# Patient Record
Sex: Female | Born: 1970 | Race: Black or African American | Hispanic: No | Marital: Married | Smoking: Never smoker
Health system: Southern US, Community
[De-identification: ages and names within clinical notes are randomized; demographics above are authoritative.]

## PROBLEM LIST (undated history)

## (undated) ENCOUNTER — Ambulatory Visit (HOSPITAL_COMMUNITY): Admission: EM

## (undated) DIAGNOSIS — B20 Human immunodeficiency virus [HIV] disease: Secondary | ICD-10-CM

## (undated) DIAGNOSIS — O8823 Thromboembolism in the puerperium: Secondary | ICD-10-CM

## (undated) DIAGNOSIS — J45909 Unspecified asthma, uncomplicated: Secondary | ICD-10-CM

## (undated) DIAGNOSIS — Z21 Asymptomatic human immunodeficiency virus [HIV] infection status: Secondary | ICD-10-CM

## (undated) DIAGNOSIS — Z332 Encounter for elective termination of pregnancy: Secondary | ICD-10-CM

## (undated) DIAGNOSIS — Z8619 Personal history of other infectious and parasitic diseases: Secondary | ICD-10-CM

## (undated) DIAGNOSIS — Z9229 Personal history of other drug therapy: Secondary | ICD-10-CM

## (undated) DIAGNOSIS — Z9889 Other specified postprocedural states: Secondary | ICD-10-CM

## (undated) DIAGNOSIS — G4761 Periodic limb movement disorder: Secondary | ICD-10-CM

## (undated) DIAGNOSIS — R7303 Prediabetes: Secondary | ICD-10-CM

## (undated) DIAGNOSIS — Z87898 Personal history of other specified conditions: Secondary | ICD-10-CM

## (undated) DIAGNOSIS — R7611 Nonspecific reaction to tuberculin skin test without active tuberculosis: Secondary | ICD-10-CM

## (undated) DIAGNOSIS — Z8669 Personal history of other diseases of the nervous system and sense organs: Secondary | ICD-10-CM

## (undated) DIAGNOSIS — I2699 Other pulmonary embolism without acute cor pulmonale: Secondary | ICD-10-CM

## (undated) HISTORY — DX: Personal history of other specified conditions: Z87.898

## (undated) HISTORY — DX: Nonspecific reaction to tuberculin skin test without active tuberculosis: R76.11

## (undated) HISTORY — DX: Personal history of other drug therapy: Z92.29

## (undated) HISTORY — DX: Human immunodeficiency virus (HIV) disease: B20

## (undated) HISTORY — DX: Asymptomatic human immunodeficiency virus (hiv) infection status: Z21

## (undated) HISTORY — DX: Personal history of other diseases of the nervous system and sense organs: Z86.69

## (undated) HISTORY — DX: Other pulmonary embolism without acute cor pulmonale: I26.99

## (undated) HISTORY — DX: Unspecified asthma, uncomplicated: J45.909

## (undated) HISTORY — DX: Periodic limb movement disorder: G47.61

## (undated) HISTORY — DX: Personal history of other infectious and parasitic diseases: Z86.19

## (undated) HISTORY — DX: Encounter for elective termination of pregnancy: Z33.2

## (undated) HISTORY — DX: Other specified postprocedural states: Z98.890

## (undated) HISTORY — DX: Prediabetes: R73.03

## (undated) HISTORY — DX: Thromboembolism in the puerperium: O88.23

---

## 2001-02-17 DIAGNOSIS — Z9889 Other specified postprocedural states: Secondary | ICD-10-CM

## 2001-02-17 HISTORY — PX: COLPOSCOPY: SHX161

## 2001-02-17 HISTORY — DX: Other specified postprocedural states: Z98.890

## 2002-02-17 DIAGNOSIS — I2699 Other pulmonary embolism without acute cor pulmonale: Secondary | ICD-10-CM

## 2002-02-17 HISTORY — DX: Other pulmonary embolism without acute cor pulmonale: I26.99

## 2011-12-22 LAB — BASIC METABOLIC PANEL
BUN: 7 mg/dL (ref 4–21)
Creatinine: 0.8 mg/dL (ref 0.5–1.1)
Potassium: 4.3 mmol/L (ref 3.4–5.3)

## 2011-12-22 LAB — LIPID PANEL
Cholesterol: 153 mg/dL (ref 0–200)
Triglycerides: 113 mg/dL (ref 40–160)

## 2011-12-22 LAB — HEPATIC FUNCTION PANEL: ALT: 17 U/L (ref 7–35)

## 2012-08-02 ENCOUNTER — Telehealth: Payer: Self-pay

## 2012-08-02 NOTE — Telephone Encounter (Signed)
Patient states she signed medical records request twice.  Once in March and again in May.  We have not received records.  Our records indicate release was signed on 06-22-2012. Faxed confirmation attached.  I will resubmit request. Once records are received I will call patient with appointment.  Pt is not able to provide medication list yet she is requesting refills.  Documentation required for medications.  11:00 am.    Fax sent.  Medical records received.  Message left on machine regarding new intake appointment. Pt advised to call the office for assistance with financial eligibility.    Laurell Josephs, RN

## 2012-08-03 ENCOUNTER — Telehealth: Payer: Self-pay

## 2012-08-03 DIAGNOSIS — D219 Benign neoplasm of connective and other soft tissue, unspecified: Secondary | ICD-10-CM

## 2012-08-03 DIAGNOSIS — Z141 Cystic fibrosis carrier: Secondary | ICD-10-CM | POA: Insufficient documentation

## 2012-08-03 DIAGNOSIS — D251 Intramural leiomyoma of uterus: Secondary | ICD-10-CM | POA: Insufficient documentation

## 2012-08-03 DIAGNOSIS — D75A Glucose-6-phosphate dehydrogenase (G6PD) deficiency without anemia: Secondary | ICD-10-CM | POA: Insufficient documentation

## 2012-08-03 DIAGNOSIS — Z227 Latent tuberculosis: Secondary | ICD-10-CM | POA: Insufficient documentation

## 2012-08-03 DIAGNOSIS — J45909 Unspecified asthma, uncomplicated: Secondary | ICD-10-CM | POA: Insufficient documentation

## 2012-08-03 DIAGNOSIS — B081 Molluscum contagiosum: Secondary | ICD-10-CM | POA: Insufficient documentation

## 2012-08-03 DIAGNOSIS — B20 Human immunodeficiency virus [HIV] disease: Secondary | ICD-10-CM

## 2012-08-03 MED ORDER — RITONAVIR 100 MG PO TABS: 100.0000 mg | ORAL_TABLET | Freq: Every day | ORAL | Status: DC

## 2012-08-03 MED ORDER — EMTRICITABINE-TENOFOVIR DF 200-300 MG PO TABS: 1.0000 | ORAL_TABLET | Freq: Every day | ORAL | Status: DC

## 2012-08-03 MED ORDER — FOSAMPRENAVIR CALCIUM 700 MG PO TABS: 1400.0000 mg | ORAL_TABLET | Freq: Every day | ORAL | Status: DC

## 2012-08-03 NOTE — Telephone Encounter (Signed)
Pt has intake scheduled for August 13, 2012. She states she will be out of medications this week.  I have received the medical records and will call her HIV medications to pharmacy.  [facility] [address].  Patient states she has [employer].   Patient advised she must keep intake appointment and no other refills will be given without visit.   Laurell Josephs, RN

## 2012-08-10 ENCOUNTER — Ambulatory Visit: Payer: Self-pay

## 2012-08-10 ENCOUNTER — Ambulatory Visit (INDEPENDENT_AMBULATORY_CARE_PROVIDER_SITE_OTHER): Payer: BC Managed Care – PPO

## 2012-08-10 DIAGNOSIS — Z23 Encounter for immunization: Secondary | ICD-10-CM

## 2012-08-10 DIAGNOSIS — Z21 Asymptomatic human immunodeficiency virus [HIV] infection status: Secondary | ICD-10-CM

## 2012-08-10 DIAGNOSIS — B20 Human immunodeficiency virus [HIV] disease: Secondary | ICD-10-CM

## 2012-08-10 DIAGNOSIS — Z9229 Personal history of other drug therapy: Secondary | ICD-10-CM

## 2012-08-10 LAB — CBC WITH DIFFERENTIAL/PLATELET
Lymphocytes Relative: 34 % (ref 12–46)
Lymphs Abs: 1.1 10*3/uL (ref 0.7–4.0)
Neutro Abs: 1.5 10*3/uL — ABNORMAL LOW (ref 1.7–7.7)
Neutrophils Relative %: 46 % (ref 43–77)
Platelets: 226 10*3/uL (ref 150–400)
RBC: 4.32 MIL/uL (ref 3.87–5.11)
WBC: 3.2 10*3/uL — ABNORMAL LOW (ref 4.0–10.5)

## 2012-08-10 LAB — HCG, SERUM, QUALITATIVE: Preg, Serum: NEGATIVE

## 2012-08-10 LAB — LIPID PANEL: HDL: 32 mg/dL — ABNORMAL LOW (ref >39)

## 2012-08-10 MED ORDER — VITAMIN D 1000 UNITS PO CAPS: 1000.0000 [IU] | ORAL_CAPSULE | Freq: Every day | ORAL | Status: DC

## 2012-08-11 DIAGNOSIS — Z9229 Personal history of other drug therapy: Secondary | ICD-10-CM | POA: Insufficient documentation

## 2012-08-11 LAB — COMPLETE METABOLIC PANEL WITH GFR
ALT: 19 U/L (ref 0–35)
CO2: 25 mEq/L (ref 19–32)
Chloride: 107 mEq/L (ref 96–112)
GFR, Est African American: >89 mL/min
Sodium: 136 mEq/L (ref 135–145)
Total Bilirubin: 0.3 mg/dL (ref 0.3–1.2)
Total Protein: 7 g/dL (ref 6.0–8.3)

## 2012-08-11 LAB — HEPATITIS B SURFACE ANTIBODY,QUALITATIVE: Hep B S Ab: REACTIVE — AB

## 2012-08-11 LAB — URINALYSIS
Hgb urine dipstick: NEGATIVE
Leukocytes, UA: NEGATIVE
Nitrite: NEGATIVE
Protein, ur: NEGATIVE mg/dL
Urobilinogen, UA: 0.2 mg/dL (ref 0.0–1.0)
pH: 6 (ref 5.0–8.0)

## 2012-08-11 LAB — HIV-1 RNA ULTRAQUANT REFLEX TO GENTYP+
HIV 1 RNA Quant: 66292 copies/mL — ABNORMAL HIGH (ref <20)
HIV-1 RNA Quant, Log: 4.82 {Log} — ABNORMAL HIGH (ref <1.30)

## 2012-08-11 LAB — HEPATITIS A ANTIBODY, TOTAL: Hep A Total Ab: POSITIVE — AB

## 2012-08-11 LAB — T-HELPER CELL (CD4) - (RCID CLINIC ONLY)
CD4 % Helper T Cell: 24 % — ABNORMAL LOW (ref 33–55)
CD4 T Cell Abs: 240 uL — ABNORMAL LOW (ref 400–2700)

## 2012-08-11 NOTE — Progress Notes (Signed)
Pt is originally from [place]. She recently moved from [place] to [place] and is here to establish care.  She has been without medications for at least two weeks.  Last week medication was called to the pharmacy. Patient was not able to have this filled at [facility] due to her insurance provider. Scipts were transferred to specialty pharmacy where she is now awaiting delivery via mail.  Other than this incident patient states she has been 100% compliant with Anti retro virals.  She is due for pap smear but refuses to update mammogram due to personal beliefs.  She has complaint of narcolepsy during day hours which cause her problems with concentrating .  LMP: December 2013.  Sex in December with incidence of condom breakage.  Patient thinks she may be pregnant since she has no menstrual cycle but had two negative urine pregnancy test.  Blood pregnancy test added to labs today.    Laurell Josephs, RN

## 2012-08-16 ENCOUNTER — Telehealth: Payer: Self-pay

## 2012-08-16 DIAGNOSIS — B20 Human immunodeficiency virus [HIV] disease: Secondary | ICD-10-CM

## 2012-08-16 MED ORDER — FOSAMPRENAVIR CALCIUM 700 MG PO TABS: 1400.0000 mg | ORAL_TABLET | Freq: Every day | ORAL | Status: DC

## 2012-08-16 MED ORDER — RITONAVIR 100 MG PO TABS: 100.0000 mg | ORAL_TABLET | Freq: Every day | ORAL | Status: DC

## 2012-08-16 MED ORDER — EMTRICITABINE-TENOFOVIR DF 200-300 MG PO TABS: 1.0000 | ORAL_TABLET | Freq: Every day | ORAL | Status: DC

## 2012-08-16 NOTE — Telephone Encounter (Signed)
Patient's insurance co pay is 20 %.  She does not have the money to pay.  Randolm Idol, Patient Assistance will assist patient to see if she qualifies for programs.  .  Medication has been sent to [facility] on [address].  Pt qualifies for program with one year of refills.  Patient was advised to review insurance policy and possibly select a better one when she is allowed to switch.    Laurell Josephs, RN

## 2012-08-18 ENCOUNTER — Telehealth: Payer: Self-pay | Admitting: *Deleted

## 2012-08-18 NOTE — Telephone Encounter (Signed)
Had v/m from Tammy from [facility].  I called Judy.  She has gotten the issue with [facility] resolved.  They are sending her medication here.  It should be here within the next 5-7 business days.

## 2012-08-19 ENCOUNTER — Encounter: Payer: Self-pay | Admitting: Internal Medicine

## 2012-08-19 ENCOUNTER — Encounter: Payer: Self-pay | Admitting: *Deleted

## 2012-08-19 ENCOUNTER — Ambulatory Visit (INDEPENDENT_AMBULATORY_CARE_PROVIDER_SITE_OTHER): Payer: BC Managed Care – PPO | Admitting: Internal Medicine

## 2012-08-19 VITALS — BP 119/78 | HR 98 | Temp 99.4°F | Ht 68.0 in | Wt 210.0 lb

## 2012-08-19 DIAGNOSIS — R7611 Nonspecific reaction to tuberculin skin test without active tuberculosis: Secondary | ICD-10-CM

## 2012-08-19 DIAGNOSIS — B20 Human immunodeficiency virus [HIV] disease: Secondary | ICD-10-CM

## 2012-08-19 DIAGNOSIS — F411 Generalized anxiety disorder: Secondary | ICD-10-CM

## 2012-08-19 DIAGNOSIS — Z227 Latent tuberculosis: Secondary | ICD-10-CM

## 2012-08-19 NOTE — Assessment & Plan Note (Signed)
I did offer her an appointment with our counselor, however she is not interested at this time.

## 2012-08-19 NOTE — Progress Notes (Signed)
  Subjective:    Patient ID: Paula Williams, female    DOB: Jul 09, 1970, 42 y.o.   MRN: [id number]  HPI He comes in as a new patient for evaluation of HIV. She has a history of HIV which was being treated in [place] prior to this and she has been on Lexiva, Norvir and Truvada. She reports good tolerance of medications and has been on them for about 4 years. She has been out of medications for about 3 months and has had problems with her health insurance which has a high deductible. She is locating medications sent her through a drug assistance program. She has had no weight loss. She does have some anxiety particularly related to being off of her antiretroviral medicines. Her CD4 count is 240. She did get a recent Pap smear here. Refuses mammogram.  She previously was not on any other regimen.   Review of Systems  Constitutional: Negative for fever, chills, activity change, fatigue and unexpected weight change.  HENT: Positive for congestion and sinus pressure. Negative for sore throat and trouble swallowing.   Eyes: Negative for redness and itching.  Respiratory: Negative for cough and shortness of breath.   Cardiovascular: Negative for leg swelling.  Gastrointestinal: Negative for nausea, abdominal pain and diarrhea.  Genitourinary: Negative for vaginal discharge, difficulty urinating and menstrual problem.  Musculoskeletal: Negative for myalgias and arthralgias.  Skin: Negative for rash.  Allergic/Immunologic: Negative for environmental allergies.  Neurological: Negative for dizziness, light-headedness and headaches.  Hematological: Negative for adenopathy.  Psychiatric/Behavioral: Negative for dysphoric mood.       Objective:   Physical Exam  Constitutional: She appears well-developed and well-nourished. No distress.  HENT:  Mouth/Throat: Oropharynx is clear and moist. No oropharyngeal exudate.  Eyes: Right eye exhibits no discharge. Left eye exhibits no discharge. No scleral icterus.   Cardiovascular: Normal rate, regular rhythm and normal heart sounds.   No murmur heard. Pulmonary/Chest: Effort normal and breath sounds normal. No respiratory distress. She has no wheezes. She has no rales.  Lymphadenopathy:    She has no cervical adenopathy.  Skin: Skin is warm and dry. No rash noted.          Assessment & Plan:

## 2012-08-19 NOTE — Assessment & Plan Note (Signed)
She was previously treated

## 2012-08-19 NOTE — Assessment & Plan Note (Addendum)
Her CD4 count is low and we'll start her previous regimen. I did discuss with her that there are other options for streamlining her regimen but this will be addressed later since she is now getting back on her previous regimen and has access to that. I will check her labs about 3 weeks after she starts her medications and I will see her a week after that.  She did have her menstruation recently and pregnancy test was negative

## 2012-09-07 ENCOUNTER — Other Ambulatory Visit: Payer: BC Managed Care – PPO

## 2012-09-07 DIAGNOSIS — B20 Human immunodeficiency virus [HIV] disease: Secondary | ICD-10-CM

## 2012-09-07 LAB — COMPLETE METABOLIC PANEL WITH GFR
ALT: 12 U/L (ref 0–35)
Albumin: 3.8 g/dL (ref 3.5–5.2)
CO2: 25 mEq/L (ref 19–32)
Calcium: 9.4 mg/dL (ref 8.4–10.5)
Chloride: 105 mEq/L (ref 96–112)
GFR, Est African American: 85 mL/min
GFR, Est Non African American: 74 mL/min
Glucose, Bld: 97 mg/dL (ref 70–99)
Potassium: 4.7 mEq/L (ref 3.5–5.3)
Sodium: 137 mEq/L (ref 135–145)
Total Bilirubin: 0.3 mg/dL (ref 0.3–1.2)
Total Protein: 7.3 g/dL (ref 6.0–8.3)

## 2012-09-07 LAB — CBC WITH DIFFERENTIAL/PLATELET
Basophils Absolute: 0 10*3/uL (ref 0.0–0.1)
Basophils Relative: 0 % (ref 0–1)
Eosinophils Absolute: 0.4 10*3/uL (ref 0.0–0.7)
MCH: 29.4 pg (ref 26.0–34.0)
MCHC: 33.3 g/dL (ref 30.0–36.0)
Monocytes Relative: 13 % — ABNORMAL HIGH (ref 3–12)
Neutro Abs: 2.1 10*3/uL (ref 1.7–7.7)
Neutrophils Relative %: 37 % — ABNORMAL LOW (ref 43–77)
Platelets: 224 10*3/uL (ref 150–400)
RDW: 15.4 % (ref 11.5–15.5)

## 2012-09-08 LAB — T-HELPER CELL (CD4) - (RCID CLINIC ONLY)
CD4 % Helper T Cell: 17 % — ABNORMAL LOW (ref 33–55)
CD4 T Cell Abs: 430 uL (ref 400–2700)

## 2012-09-09 LAB — HIV-1 RNA QUANT-NO REFLEX-BLD: HIV-1 RNA Quant, Log: 2.63 {Log} — ABNORMAL HIGH (ref <1.30)

## 2012-09-13 ENCOUNTER — Telehealth: Payer: Self-pay | Admitting: *Deleted

## 2012-09-13 NOTE — Telephone Encounter (Signed)
Received fax from [facility].  Patient is not eligible per fax.  I will call and see what the problem is.

## 2012-10-05 ENCOUNTER — Ambulatory Visit: Payer: BC Managed Care – PPO | Admitting: Internal Medicine

## 2012-10-05 ENCOUNTER — Telehealth: Payer: Self-pay | Admitting: *Deleted

## 2012-10-05 NOTE — Telephone Encounter (Signed)
Left message informing pt of missed appointment, asked her to please call and reschedule at her earliest convenience. Pt is new to our office and has only been seen once. Andree Coss, RN

## 2012-10-06 ENCOUNTER — Other Ambulatory Visit: Payer: Self-pay | Admitting: Internal Medicine

## 2012-10-06 DIAGNOSIS — B20 Human immunodeficiency virus [HIV] disease: Secondary | ICD-10-CM

## 2012-10-11 ENCOUNTER — Telehealth: Payer: Self-pay | Admitting: *Deleted

## 2012-10-11 NOTE — Telephone Encounter (Signed)
Paula Williams came in to see about assistance with her bills.  She does not qualify for [employer] or the [employer] card due to the fact she has insurance.  She has $11,000.00 deductable.  She has met almost $6,000.00.  I am re-enrolling her in the [facility] for the additional $4000.00 grant.  I gave her co-pay cards for all of her medications.

## 2012-10-12 ENCOUNTER — Telehealth: Payer: Self-pay | Admitting: *Deleted

## 2012-10-12 NOTE — Telephone Encounter (Signed)
Paula Williams has been approved for an additional $4000.00 from the [facility]

## 2012-11-05 ENCOUNTER — Telehealth: Payer: Self-pay | Admitting: *Deleted

## 2012-11-05 ENCOUNTER — Other Ambulatory Visit: Payer: Self-pay | Admitting: Internal Medicine

## 2012-11-05 DIAGNOSIS — B20 Human immunodeficiency virus [HIV] disease: Secondary | ICD-10-CM

## 2012-11-05 NOTE — Telephone Encounter (Signed)
Contacted patient re: missed appointment and refills.  Per patient, she cannot come in because the amount of money she owes for medications and office visits is too much.  RN advised her that she needs to let us know about these issues so that we can find ways to assist her without gaps in her care.  RN put her through to [employer] voicemail re: [employer] and an $11,000 deductible.  RN will send in 30 days of medications with the expectation that the patient will make a follow up appointment with Dr Luciana Axe after speaking to Ihor Dow, Zachary George, RN

## 2012-12-06 ENCOUNTER — Other Ambulatory Visit: Payer: Self-pay | Admitting: Internal Medicine

## 2012-12-23 ENCOUNTER — Other Ambulatory Visit: Payer: Self-pay

## 2012-12-29 ENCOUNTER — Telehealth: Payer: Self-pay | Admitting: *Deleted

## 2012-12-29 NOTE — Telephone Encounter (Signed)
Patient called and advised she is having a sinus infection for about a week now and it is getting unbearable. She advised it started with a slight headache 2 1/2 weeks ago when she had some teeth pulled and she went back to the dentist and he gave her Amoxicillian which she has taken and now it is back and worse. Her head and face hurts really badly and nothing makes it feel better. She wants to be seen. Advised her we  Have an appt 12/30/12 at 1030 to see the doctor she advised she will take it and she will do labs work while she is here as she has not been seen since 08/2012.

## 2012-12-30 ENCOUNTER — Encounter: Payer: Self-pay | Admitting: Internal Medicine

## 2012-12-30 ENCOUNTER — Ambulatory Visit (INDEPENDENT_AMBULATORY_CARE_PROVIDER_SITE_OTHER): Payer: BC Managed Care – PPO | Admitting: Internal Medicine

## 2012-12-30 VITALS — BP 146/88 | HR 74 | Temp 98.5°F | Wt 212.0 lb

## 2012-12-30 DIAGNOSIS — B20 Human immunodeficiency virus [HIV] disease: Secondary | ICD-10-CM

## 2012-12-30 DIAGNOSIS — Z23 Encounter for immunization: Secondary | ICD-10-CM

## 2012-12-30 MED ORDER — ELVITEG-COBIC-EMTRICIT-TENOFDF 150-150-200-300 MG PO TABS: 1.0000 | ORAL_TABLET | Freq: Every day | ORAL | Status: DC

## 2012-12-30 NOTE — Progress Notes (Signed)
[facility] NOTE  RFV: establishing care Subjective:    Patient ID: Paula Williams, female    DOB: 08-19-1970, 42 y.o.   MRN: [id number]  HPI  42yo with HIV, CD 4 count of 430/VL 425 ( July 2014) on fosampranivir/ritonavir/truvada for 1 month, previous VL of 66,250. Had been on sustiva/combivir but had dreams. Has had HIV for more than 15 yr.  Moved to [place] in 6- 7 months. Not presently working. 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 She is doing well. Cares for herself and her son. She is originally from [place]. Last visited her family in Dec 2013. She has large out of pocket costs for medications, especially without being employed. No other health complaints  Current Outpatient Prescriptions on File Prior to Visit  Medication Sig Dispense Refill  . calcium-vitamin D (OSCAL WITH D) 500-200 MG-UNIT per tablet Take 1 tablet by mouth daily.      . Cholecalciferol (VITAMIN D) 1000 UNITS capsule Take 1 capsule (1,000 Units total) by mouth daily.  30 capsule  0  . LEXIVA 700 MG tablet TAKE 2 TABLETS (1400MG )   EVERY DAY  60 tablet  3  . loratadine (CLARITIN) 10 MG tablet Take 10 mg by mouth daily.      . NORVIR 100 MG TABS tablet TAKE 1 TABLET EVERY DAY  30 tablet  3  . traMADol (ULTRAM) 50 MG tablet Take 50 mg by mouth every 6 (six) hours as needed for pain.      . TRUVADA 200-300 MG per tablet TAKE 1 TABLET EVERY DAY  30 tablet  3   No current facility-administered medications on file prior to visit.   Active Ambulatory Problems    Diagnosis Date Noted  . Fibroids 08/03/2012  . 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 Asthma, chronic 08/03/2012  . G6PD deficiency 08/03/2012  . 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 Intramural leiomyoma of uterus 08/03/2012  . Molluscum contagiosum 08/03/2012  . History of BCG vaccination 08/11/2012  . HIV disease 08/19/2012  . Anxiety state, unspecified 08/19/2012   Resolved Ambulatory Problems    Diagnosis Date Noted   . No Resolved Ambulatory Problems   Past Medical History  Diagnosis Date  . Pulmonary embolism, blood-clot, obstetric, postpartum condition   . Periodic limb movement disorder   . Pulmonary embolism 2004  . History of measles, mumps, or rubella   . Positive PPD, treated   . H/O herpes zoster   . H/O peripheral neuropathy   . Elective abortion   . H/O colposcopy with cervical biopsy 2003    Soc hx: single mom with 103 yo child. Pays $300 per month  family history includes Diabetes in her mother; Hypertension in her father.  Review of Systems   Constitutional: Negative for fever, chills, diaphoresis, activity change, appetite change, fatigue and unexpected weight change.  HENT: Negative for congestion, sore throat, rhinorrhea, sneezing, trouble swallowing and sinus pressure.  Eyes: Negative for photophobia and visual disturbance.  Respiratory: Negative for cough, chest tightness, shortness of breath, wheezing and stridor.  Cardiovascular: Negative for chest pain, palpitations and leg swelling.  Gastrointestinal: Negative for nausea, vomiting, abdominal pain, diarrhea, constipation, blood in stool, abdominal distention and anal bleeding.  Genitourinary: Negative for dysuria, hematuria, flank pain and difficulty urinating.  Musculoskeletal: Negative for myalgias, back pain, joint swelling, arthralgias and gait problem.  Skin: Negative for color change, pallor, rash and wound.  Neurological: Negative for dizziness, tremors, weakness and light-headedness.  Hematological: Negative for adenopathy. Does not bruise/bleed easily.  Psychiatric/Behavioral: Negative for behavioral problems, confusion, sleep disturbance, dysphoric mood, decreased concentration and agitation.       Objective:   Physical Exam BP 146/88  Pulse 74  Temp(Src) 98.5 F (36.9 C) (Oral)  Wt 212 lb (96.163 kg) Physical Exam  Constitutional:  oriented to person, place, and time.  appears well-developed and  well-nourished. No distress.  HENT:  Mouth/Throat: Oropharynx is clear and moist. No oropharyngeal exudate.  Cardiovascular: Normal rate, regular rhythm and normal heart sounds. Exam reveals no gallop and no friction rub.  No murmur heard.  Pulmonary/Chest: Effort normal and breath sounds normal. No respiratory distress.no wheezes.  Abdominal: Soft. Bowel sounds are normal.  exhibits no distension. There is no tenderness.  Lymphadenopathy: no cervical adenopathy.  Neurological:  alert and oriented to person, place, and time.  Skin: Skin is warm and dry. No rash noted. No erythema.  Psychiatric:  normal mood and affect. behavior is normal.      Assessment & Plan:  HIV= will switch to stribild and discontinue with lexiva/norvir/truvada. Will also check for patient assistance. Get co-pay card. Asked her to consider getting adap for herself and consider her son getting Medicaid  rtc in 4-6 wks to see how she tolerates medicaiton

## 2013-01-10 ENCOUNTER — Telehealth: Payer: Self-pay | Admitting: *Deleted

## 2013-01-10 NOTE — Telephone Encounter (Signed)
Appt made for 12/1/ 14 @ 0900.

## 2013-01-17 ENCOUNTER — Ambulatory Visit (INDEPENDENT_AMBULATORY_CARE_PROVIDER_SITE_OTHER): Payer: BC Managed Care – PPO | Admitting: *Deleted

## 2013-01-17 DIAGNOSIS — Z124 Encounter for screening for malignant neoplasm of cervix: Secondary | ICD-10-CM

## 2013-01-17 DIAGNOSIS — Z113 Encounter for screening for infections with a predominantly sexual mode of transmission: Secondary | ICD-10-CM
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
## 2013-01-17 NOTE — Patient Instructions (Signed)
  Your results will be ready in about a week.  You may look them up on MyChart.  Thank you for coming to the Center for your care.  Denise, RN 

## 2013-01-31 ENCOUNTER — Encounter: Payer: Self-pay | Admitting: *Deleted

## 2013-03-10 ENCOUNTER — Other Ambulatory Visit: Payer: Self-pay | Admitting: *Deleted

## 2013-03-10 DIAGNOSIS — B20 Human immunodeficiency virus [HIV] disease: Secondary | ICD-10-CM

## 2013-03-14 ENCOUNTER — Other Ambulatory Visit: Payer: BC Managed Care – PPO

## 2013-04-07 ENCOUNTER — Ambulatory Visit (INDEPENDENT_AMBULATORY_CARE_PROVIDER_SITE_OTHER): Payer: BC Managed Care – PPO | Admitting: Internal Medicine

## 2013-04-07 ENCOUNTER — Encounter: Payer: Self-pay | Admitting: Internal Medicine

## 2013-04-07 VITALS — BP 133/88 | HR 80 | Temp 98.1°F | Ht 66.0 in | Wt 215.0 lb

## 2013-04-07 DIAGNOSIS — B20 Human immunodeficiency virus [HIV] disease: Secondary | ICD-10-CM

## 2013-04-07 NOTE — Assessment & Plan Note (Signed)
She is hesitant to get labs for fear of a bill.  She is to call her insurance company and see what is covered and discuss with Cabin crew.  I did explain that we absolutely need to get labs to monitor on Stribild.  RTC 4 weeks.

## 2013-04-07 NOTE — Progress Notes (Signed)
   Subjective:    Patient ID: Paula Williams, female    DOB: May 20, 1970, 43 y.o.   MRN: [id number]  HPI Here for follow up of HIV.  On Stribild and last seen in November with viral load of 400.  Previously was on fosampranivir/rit/Truvada.  Has missed follow up visits.  Last CD4 430 up from 240.  Has insurance but has been receiving bills for labs and is hesitant to get labs.  Is compliant with Stribild.  No weight loss, no diarrhea.  Has noted some central obesity but improved some off of Lexiva.     Review of Systems  Constitutional: Negative for fatigue and unexpected weight change.  Gastrointestinal: Negative for nausea and diarrhea.  Neurological: Negative for dizziness and light-headedness.       Objective:   Physical Exam  Constitutional: She appears well-developed and well-nourished. No distress.  HENT:  Mouth/Throat: No oropharyngeal exudate.  Eyes: No scleral icterus.  Cardiovascular: Normal rate, regular rhythm and normal heart sounds.   No murmur heard. Pulmonary/Chest: Effort normal and breath sounds normal. No respiratory distress. She has no wheezes.  Lymphadenopathy:    She has no cervical adenopathy.  Skin: No rash noted.          Assessment & Plan:

## 2013-05-11 ENCOUNTER — Telehealth: Payer: Self-pay | Admitting: *Deleted

## 2013-05-11 NOTE — Telephone Encounter (Signed)
Per patient she has been on Stribild for 2 months. Lab appt scheduled for 05/17/13. Myrtis Hopping

## 2013-05-11 NOTE — Telephone Encounter (Signed)
Message copied by Georgena Spurling on Wed May 11, 2013  5:01 PM ------      Message from: Thayer Headings      Created: Wed May 11, 2013  3:23 PM       Could you please remind her that she needs to get labs before her next refill of meds to be sure there are no side effects from her new regimen.  Also, please make sure her pharmacy does not refill the meds again until labs done.  Thanks.   ------

## 2013-05-14 ENCOUNTER — Encounter (HOSPITAL_COMMUNITY): Payer: Self-pay | Admitting: Emergency Medicine

## 2013-05-14 ENCOUNTER — Emergency Department (HOSPITAL_COMMUNITY): Payer: BC Managed Care – PPO

## 2013-05-14 DIAGNOSIS — Z9889 Other specified postprocedural states: Secondary | ICD-10-CM | POA: Insufficient documentation

## 2013-05-14 DIAGNOSIS — R000 Tachycardia, unspecified: Secondary | ICD-10-CM | POA: Insufficient documentation

## 2013-05-14 DIAGNOSIS — Z86711 Personal history of pulmonary embolism: Secondary | ICD-10-CM | POA: Insufficient documentation

## 2013-05-14 DIAGNOSIS — Z21 Asymptomatic human immunodeficiency virus [HIV] infection status: Secondary | ICD-10-CM | POA: Insufficient documentation

## 2013-05-14 DIAGNOSIS — J159 Unspecified bacterial pneumonia: Secondary | ICD-10-CM | POA: Insufficient documentation

## 2013-05-14 DIAGNOSIS — Z8669 Personal history of other diseases of the nervous system and sense organs: Secondary | ICD-10-CM | POA: Insufficient documentation

## 2013-05-14 DIAGNOSIS — Z79899 Other long term (current) drug therapy: Secondary | ICD-10-CM | POA: Insufficient documentation

## 2013-05-14 DIAGNOSIS — J45901 Unspecified asthma with (acute) exacerbation: Secondary | ICD-10-CM | POA: Insufficient documentation

## 2013-05-14 DIAGNOSIS — Z8619 Personal history of other infectious and parasitic diseases: Secondary | ICD-10-CM | POA: Insufficient documentation

## 2013-05-14 DIAGNOSIS — IMO0002 Reserved for concepts with insufficient information to code with codable children: Secondary | ICD-10-CM | POA: Insufficient documentation

## 2013-05-14 LAB — I-STAT CHEM 8, ED
BUN: 8 mg/dL (ref 6–23)
CHLORIDE: 101 meq/L (ref 96–112)
Calcium, Ion: 1.12 mmol/L (ref 1.12–1.23)
Creatinine, Ser: 1.1 mg/dL (ref 0.50–1.10)
Glucose, Bld: 105 mg/dL — ABNORMAL HIGH (ref 70–99)
HCT: 42 % (ref 36.0–46.0)
Hemoglobin: 14.3 g/dL (ref 12.0–15.0)
POTASSIUM: 4.4 meq/L (ref 3.7–5.3)
Sodium: 138 mEq/L (ref 137–147)
TCO2: 25 mmol/L (ref 0–100)

## 2013-05-14 LAB — CBC WITH DIFFERENTIAL/PLATELET
BASOS PCT: 0 % (ref 0–1)
Basophils Absolute: 0 10*3/uL (ref 0.0–0.1)
EOS ABS: 0.3 10*3/uL (ref 0.0–0.7)
EOS PCT: 3 % (ref 0–5)
HEMATOCRIT: 38.2 % (ref 36.0–46.0)
Hemoglobin: 13 g/dL (ref 12.0–15.0)
LYMPHS ABS: 1.1 10*3/uL (ref 0.7–4.0)
LYMPHS PCT: 12 % (ref 12–46)
MCH: 30.3 pg (ref 26.0–34.0)
MCHC: 34 g/dL (ref 30.0–36.0)
MCV: 89 fL (ref 78.0–100.0)
Monocytes Absolute: 0.9 10*3/uL (ref 0.1–1.0)
Monocytes Relative: 10 % (ref 3–12)
NEUTROS ABS: 7 10*3/uL (ref 1.7–7.7)
Neutrophils Relative %: 75 % (ref 43–77)
Platelets: 180 10*3/uL (ref 150–400)
RBC: 4.29 MIL/uL (ref 3.87–5.11)
RDW: 13.8 % (ref 11.5–15.5)
WBC: 9.4 10*3/uL (ref 4.0–10.5)

## 2013-05-14 NOTE — ED Notes (Signed)
Patient transported to X-ray 

## 2013-05-14 NOTE — ED Notes (Signed)
Thinks she is having a sinus infection.  Feels like chest feels heavy. Hard to breath through nose, and having to breathe faster. Took some ibuprofen for sore throat and fever. Productive cough: yellow.

## 2013-05-15 ENCOUNTER — Emergency Department (HOSPITAL_COMMUNITY)
Admission: EM | Admit: 2013-05-15 | Discharge: 2013-05-15 | Disposition: A | Discharge status: Home / Self Care | Payer: BC Managed Care – PPO | Attending: Emergency Medicine | Admitting: Emergency Medicine

## 2013-05-15 DIAGNOSIS — J189 Pneumonia, unspecified organism: Secondary | ICD-10-CM

## 2013-05-15 DIAGNOSIS — J45901 Unspecified asthma with (acute) exacerbation: Secondary | ICD-10-CM

## 2013-05-15 LAB — I-STAT CG4 LACTIC ACID, ED: Lactic Acid, Venous: 1.17 mmol/L (ref 0.5–2.2)

## 2013-05-15 LAB — RAPID STREP SCREEN (MED CTR MEBANE ONLY): Streptococcus, Group A Screen (Direct): NEGATIVE

## 2013-05-15 MED ORDER — KETOROLAC TROMETHAMINE 15 MG/ML IJ SOLN
15.0000 mg | Freq: Once | INTRAMUSCULAR | Status: AC
  Administered 2013-05-15: 15 mg via INTRAVENOUS
  Filled 2013-05-15: qty 1

## 2013-05-15 MED ORDER — ALBUTEROL SULFATE HFA 108 (90 BASE) MCG/ACT IN AERS: 1.0000 | INHALATION_SPRAY | Freq: Four times a day (QID) | RESPIRATORY_TRACT | Status: DC | PRN

## 2013-05-15 MED ORDER — LEVOFLOXACIN 750 MG PO TABS: 750.0000 mg | ORAL_TABLET | Freq: Every day | ORAL | Status: DC

## 2013-05-15 MED ORDER — LEVOFLOXACIN 750 MG PO TABS
750.0000 mg | ORAL_TABLET | Freq: Once | ORAL | Status: AC
  Administered 2013-05-15: 750 mg via ORAL
  Filled 2013-05-15: qty 1

## 2013-05-15 MED ORDER — ALBUTEROL SULFATE (2.5 MG/3ML) 0.083% IN NEBU
5.0000 mg | INHALATION_SOLUTION | Freq: Once | RESPIRATORY_TRACT | Status: AC
  Administered 2013-05-15: 5 mg via RESPIRATORY_TRACT
  Filled 2013-05-15: qty 6

## 2013-05-15 MED ORDER — SODIUM CHLORIDE 0.9 % IV BOLUS (SEPSIS)
1000.0000 mL | Freq: Once | INTRAVENOUS | Status: AC
  Administered 2013-05-15: 1000 mL via INTRAVENOUS

## 2013-05-15 MED ORDER — SODIUM CHLORIDE 0.9 % IV BOLUS (SEPSIS)
500.0000 mL | Freq: Once | INTRAVENOUS | Status: AC
  Administered 2013-05-15: 500 mL via INTRAVENOUS

## 2013-05-15 MED ORDER — ACETAMINOPHEN 325 MG PO TABS
650.0000 mg | ORAL_TABLET | Freq: Once | ORAL | Status: AC
  Administered 2013-05-15: 650 mg via ORAL
  Filled 2013-05-15: qty 2

## 2013-05-15 MED ORDER — DEXAMETHASONE SODIUM PHOSPHATE 10 MG/ML IJ SOLN
8.0000 mg | Freq: Once | INTRAMUSCULAR | Status: AC
  Administered 2013-05-15: 8 mg via INTRAVENOUS
  Filled 2013-05-15: qty 1
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
## 2013-05-15 NOTE — ED Notes (Signed)
Pt. Family member asked to step out to lobby so MD could talk to Pt. Privately. Pt. Did not disclose to either MD or RN when she was asked what her DX were of the meds she took that she had the DX of HIV. Once family member was removed she did disclose that she had HIV and was glad it had not been mentioned in front of him.

## 2013-05-15 NOTE — Discharge Instructions (Signed)
If you were given medicines take as directed.  If you are on coumadin or contraceptives realize their levels and effectiveness is altered by many different medicines.  If you have any reaction (rash, tongues swelling, other) to the medicines stop taking and see a physician.   Please follow up as directed and return to the ER or see a physician for new or worsening symptoms.  Thank you. Filed Vitals:   05/15/13 0430 05/15/13 0445 05/15/13 0530 05/15/13 0605  BP: 155/78 154/99 151/83   Pulse:  111 104   Temp:      TempSrc:      Resp:      Height:      Weight:      SpO2:  99% 100% 100%   Follow up with your infectious disease doctor on Monday.

## 2013-05-16 LAB — CULTURE, GROUP A STREP

## 2013-05-17 ENCOUNTER — Ambulatory Visit (INDEPENDENT_AMBULATORY_CARE_PROVIDER_SITE_OTHER): Payer: BC Managed Care – PPO | Admitting: Infectious Diseases

## 2013-05-17 ENCOUNTER — Other Ambulatory Visit: Payer: BC Managed Care – PPO

## 2013-05-17 ENCOUNTER — Encounter: Payer: Self-pay | Admitting: Infectious Diseases

## 2013-05-17 VITALS — BP 161/99 | HR 68 | Temp 98.2°F | Wt 223.0 lb

## 2013-05-17 DIAGNOSIS — B20 Human immunodeficiency virus [HIV] disease: Secondary | ICD-10-CM

## 2013-05-17 DIAGNOSIS — J45909 Unspecified asthma, uncomplicated: Secondary | ICD-10-CM

## 2013-05-17 MED ORDER — BECLOMETHASONE DIPROPIONATE 80 MCG/ACT IN AERS: 1.0000 | INHALATION_SPRAY | Freq: Two times a day (BID) | RESPIRATORY_TRACT | Status: DC

## 2013-05-17 MED ORDER — ALBUTEROL SULFATE HFA 108 (90 BASE) MCG/ACT IN AERS: 2.0000 | INHALATION_SPRAY | Freq: Four times a day (QID) | RESPIRATORY_TRACT | Status: DC | PRN

## 2013-05-17 MED ORDER — PREDNISONE (PAK) 10 MG PO TABS: ORAL_TABLET | Freq: Every day | ORAL | Status: DC

## 2013-05-17 NOTE — Addendum Note (Signed)
Addended by: Dolan Amen D on: 05/17/2013 11:38 AM   Modules accepted: Orders

## 2013-05-17 NOTE — Progress Notes (Signed)
   Subjective:    Patient ID: Paula Williams, female    DOB: 01-29-1971, 43 y.o.   MRN: [id number]  Cough Associated symptoms include shortness of breath and wheezing. Pertinent negatives include no chills or fever.   43 yo F with hx of HIV+ (dx ~20 yr ago). Prev on fAMPr/TRV, changed to stribild Jan 2015.  Also with hx of asthma, no "attacks" in lat 5 years.  Was seen in ED 3-29 (stayed overnight) and was given nebs,  Had CXR showing Mild/faint patchy opacity in the right mid/lower lung, possibly reflecting right lower lobe pneumonia. She was given levaqun, dexamethasone, and sent home.  Still has SOB, wheezing. No f/c (since in ED). Taking ibuprofen. Has not had any inhalers since d/c (due to insurance).    Review of Systems  Constitutional: Negative for fever, chills and appetite change.  Respiratory: Positive for cough, shortness of breath and wheezing.   Gastrointestinal: Negative for diarrhea and constipation.  Genitourinary: Negative for difficulty urinating.       Objective:   Physical Exam  Constitutional: She appears well-developed and well-nourished.  HENT:  Mouth/Throat: No oropharyngeal exudate.  Eyes: EOM are normal. Pupils are equal, round, and reactive to light.  Neck: Neck supple.  Cardiovascular: Normal rate, regular rhythm and normal heart sounds.   Pulmonary/Chest: She has wheezes.  Abdominal: Soft. Bowel sounds are normal. There is no tenderness.  Musculoskeletal: She exhibits no edema.  Lymphadenopathy:    She has no cervical adenopathy.          Assessment & Plan:

## 2013-05-17 NOTE — Assessment & Plan Note (Addendum)
Will give her albuterol now, oral steroids Will give her inhaled steroids (q-var). Will get her albuterol rx.  Continue levaquin Will have her call back in 24-48h if she is not improving.    Add- she recieves albuterol neb in clinic, feels improved.

## 2013-05-18 LAB — T-HELPER CELL (CD4) - (RCID CLINIC ONLY)
CD4 T CELL ABS: 720 /uL (ref 400–2700)
CD4 T CELL HELPER: 21 % — AB (ref 33–55)

## 2013-05-18 LAB — HIV-1 RNA QUANT-NO REFLEX-BLD
HIV 1 RNA Quant: <20 copies/mL (ref <20)
HIV-1 RNA Quant, Log: <1.3 {Log} (ref <1.30)

## 2013-05-21 LAB — CULTURE, BLOOD (ROUTINE X 2)
Culture: NO GROWTH
Culture: NO GROWTH

## 2013-07-27 ENCOUNTER — Emergency Department (HOSPITAL_COMMUNITY)
Admission: EM | Admit: 2013-07-27 | Discharge: 2013-07-27 | Disposition: A | Discharge status: Home / Self Care | Payer: BC Managed Care – PPO | Attending: Emergency Medicine | Admitting: Emergency Medicine

## 2013-07-27 ENCOUNTER — Emergency Department (HOSPITAL_COMMUNITY): Payer: BC Managed Care – PPO

## 2013-07-27 ENCOUNTER — Encounter (HOSPITAL_COMMUNITY): Payer: Self-pay | Admitting: Emergency Medicine

## 2013-07-27 DIAGNOSIS — Z86711 Personal history of pulmonary embolism: Secondary | ICD-10-CM | POA: Insufficient documentation

## 2013-07-27 DIAGNOSIS — Z79899 Other long term (current) drug therapy: Secondary | ICD-10-CM | POA: Insufficient documentation

## 2013-07-27 DIAGNOSIS — J189 Pneumonia, unspecified organism: Secondary | ICD-10-CM

## 2013-07-27 DIAGNOSIS — Z9889 Other specified postprocedural states: Secondary | ICD-10-CM | POA: Insufficient documentation

## 2013-07-27 DIAGNOSIS — Z21 Asymptomatic human immunodeficiency virus [HIV] infection status: Secondary | ICD-10-CM | POA: Insufficient documentation

## 2013-07-27 DIAGNOSIS — J159 Unspecified bacterial pneumonia: Secondary | ICD-10-CM | POA: Insufficient documentation

## 2013-07-27 DIAGNOSIS — Z792 Long term (current) use of antibiotics: Secondary | ICD-10-CM | POA: Insufficient documentation

## 2013-07-27 DIAGNOSIS — G4761 Periodic limb movement disorder: Secondary | ICD-10-CM | POA: Insufficient documentation

## 2013-07-27 DIAGNOSIS — Z87898 Personal history of other specified conditions: Secondary | ICD-10-CM | POA: Insufficient documentation

## 2013-07-27 DIAGNOSIS — Z789 Other specified health status: Secondary | ICD-10-CM | POA: Insufficient documentation

## 2013-07-27 DIAGNOSIS — Z8619 Personal history of other infectious and parasitic diseases: Secondary | ICD-10-CM | POA: Insufficient documentation

## 2013-07-27 DIAGNOSIS — Z8669 Personal history of other diseases of the nervous system and sense organs: Secondary | ICD-10-CM | POA: Insufficient documentation

## 2013-07-27 DIAGNOSIS — R079 Chest pain, unspecified: Secondary | ICD-10-CM

## 2013-07-27 LAB — CBC WITH DIFFERENTIAL/PLATELET
Basophils Absolute: 0 10*3/uL (ref 0.0–0.1)
Basophils Relative: 1 % (ref 0–1)
Eosinophils Absolute: 0.3 10*3/uL (ref 0.0–0.7)
Eosinophils Relative: 7 % — ABNORMAL HIGH (ref 0–5)
HEMATOCRIT: 38 % (ref 36.0–46.0)
Hemoglobin: 12.7 g/dL (ref 12.0–15.0)
LYMPHS ABS: 2.1 10*3/uL (ref 0.7–4.0)
LYMPHS PCT: 48 % — AB (ref 12–46)
MCH: 29.9 pg (ref 26.0–34.0)
MCHC: 33.4 g/dL (ref 30.0–36.0)
MCV: 89.4 fL (ref 78.0–100.0)
MONO ABS: 0.4 10*3/uL (ref 0.1–1.0)
Monocytes Relative: 10 % (ref 3–12)
NEUTROS ABS: 1.5 10*3/uL — AB (ref 1.7–7.7)
NEUTROS PCT: 34 % — AB (ref 43–77)
Platelets: 215 10*3/uL (ref 150–400)
RBC: 4.25 MIL/uL (ref 3.87–5.11)
RDW: 14.1 % (ref 11.5–15.5)
WBC: 4.2 10*3/uL (ref 4.0–10.5)

## 2013-07-27 LAB — BASIC METABOLIC PANEL
BUN: 13 mg/dL (ref 6–23)
CO2: 28 meq/L (ref 19–32)
CREATININE: 1.03 mg/dL (ref 0.50–1.10)
Calcium: 9.4 mg/dL (ref 8.4–10.5)
Chloride: 101 mEq/L (ref 96–112)
GFR calc non Af Amer: 65 mL/min — ABNORMAL LOW (ref >90)
GFR, EST AFRICAN AMERICAN: 75 mL/min — AB (ref >90)
GLUCOSE: 124 mg/dL — AB (ref 70–99)
POTASSIUM: 4.1 meq/L (ref 3.7–5.3)
Sodium: 140 mEq/L (ref 137–147)

## 2013-07-27 LAB — I-STAT TROPONIN, ED
TROPONIN I, POC: 0.01 ng/mL (ref 0.00–0.08)
Troponin i, poc: 0 ng/mL (ref 0.00–0.08)

## 2013-07-27 MED ORDER — CEFUROXIME AXETIL 500 MG PO TABS: 500.0000 mg | ORAL_TABLET | Freq: Two times a day (BID) | ORAL | Status: DC

## 2013-07-27 MED ORDER — AZITHROMYCIN 250 MG PO TABS
500.0000 mg | ORAL_TABLET | Freq: Once | ORAL | Status: AC
  Administered 2013-07-27: 500 mg via ORAL
  Filled 2013-07-27: qty 2

## 2013-07-27 MED ORDER — AZITHROMYCIN 250 MG PO TABS: 250.0000 mg | ORAL_TABLET | Freq: Every day | ORAL | Status: DC

## 2013-07-27 MED ORDER — CEFUROXIME AXETIL 500 MG PO TABS
500.0000 mg | ORAL_TABLET | Freq: Once | ORAL | Status: AC
  Administered 2013-07-27: 500 mg via ORAL
  Filled 2013-07-27: qty 1

## 2013-07-27 MED ORDER — OXYCODONE-ACETAMINOPHEN 5-325 MG PO TABS
1.0000 | ORAL_TABLET | Freq: Four times a day (QID) | ORAL | Status: DC | PRN
Start: 2013-07-27 — End: 2013-10-31

## 2013-07-27 MED ORDER — CEFUROXIME AXETIL 500 MG PO TABS
500.0000 mg | ORAL_TABLET | Freq: Two times a day (BID) | ORAL | Status: DC
  Filled 2013-07-27 (×2): qty 1

## 2013-07-27 MED ORDER — MORPHINE SULFATE 4 MG/ML IJ SOLN
4.0000 mg | Freq: Once | INTRAMUSCULAR | Status: AC
  Administered 2013-07-27: 4 mg via INTRAVENOUS
  Filled 2013-07-27: qty 1

## 2013-07-27 MED ORDER — ONDANSETRON HCL 4 MG/2ML IJ SOLN
4.0000 mg | Freq: Once | INTRAMUSCULAR | Status: AC
  Administered 2013-07-27: 4 mg via INTRAVENOUS
  Filled 2013-07-27: qty 2

## 2013-07-27 MED ORDER — IOHEXOL 350 MG/ML SOLN
80.0000 mL | Freq: Once | INTRAVENOUS | Status: AC | PRN
  Administered 2013-07-27: 74 mL via INTRAVENOUS

## 2013-07-27 MED ORDER — AMOXICILLIN 500 MG PO CAPS: 1000.0000 mg | ORAL_CAPSULE | Freq: Once | ORAL | Status: DC

## 2013-07-27 NOTE — Discharge Instructions (Signed)
Return to the ED with any concerns including difficulty breathing, worsening pain, fainting, decreased level of alertness/lethargy, vomiting and not able to keep down antibiotics, or any other alarming symptoms

## 2013-07-27 NOTE — ED Notes (Signed)
Patient placed on cardiac monitoring equipment

## 2013-07-27 NOTE — ED Notes (Signed)
Pt presents to department for evaluation of midsternal chest pain radiating to R side of chest. Onset this morning @ 09:00. 5/10 pain upon arrival, pt states pain increases with movement and deep breathing. Skin warm and dry. Pt is alert and oriented x4. NAD.
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
## 2013-07-27 NOTE — ED Notes (Signed)
Patient transported to CT 

## 2013-07-27 NOTE — ED Notes (Addendum)
   C/o right sided chest pain radiate to right arm, hurts worse with movement, deep breathing, and palpation.    Describes pain quality as sharp, 5/10, started 0900 this am when she was taking a shower and constant since that time.    States she feels hot and cold sometimes recently, denies cough but states it hurts worse when she coughs.  States she feels sweaty.  Denies recent strenuous activity,   Denies medical hx other than asthma and HIV.  Denies HTN, diabetes, smoking, high cholesterol, cardiac hx.

## 2013-08-03 ENCOUNTER — Ambulatory Visit (INDEPENDENT_AMBULATORY_CARE_PROVIDER_SITE_OTHER): Payer: BC Managed Care – PPO | Admitting: Internal Medicine

## 2013-08-03 VITALS — BP 130/85 | HR 105 | Temp 99.0°F | Ht 66.0 in | Wt 222.0 lb

## 2013-08-03 DIAGNOSIS — B9789 Other viral agents as the cause of diseases classified elsewhere: Secondary | ICD-10-CM

## 2013-08-03 DIAGNOSIS — B349 Viral infection, unspecified: Secondary | ICD-10-CM

## 2013-08-03 NOTE — Assessment & Plan Note (Signed)
Supportive care.  Chest pain is reproducible and no concerning signs.  Has been evaluated in ED.

## 2013-08-03 NOTE — Progress Notes (Signed)
   Subjective:    Patient ID: Paula Williams, female    DOB: Apr 17, 1970, 43 y.o.   MRN: [id number]  Headache  Associated symptoms include ear pain, a fever and nausea. Pertinent negatives include no abdominal pain or vomiting.  Fever  Associated symptoms include ear pain, headaches and nausea. Pertinent negatives include no abdominal pain, diarrhea, rash or vomiting.   Here for a work in visit.  Seen in ED last week with a complaint of chest pain.  Was evaluated with CXR and CT for concern for PE and was negative.  She was then treated for pneumonia with concern for patchy infiltrate of left lower lobe.  She had no complaint for fever or chills then.  Several days ago began with fever up to 104, chills, muscle aches, ear pain.   No sick contacts.  Decreased appetite, drinking ok.     Review of Systems  Constitutional: Positive for fever.  HENT: Positive for ear pain.   Cardiovascular:       Pleuritic chest pain  Gastrointestinal: Positive for nausea. Negative for vomiting, abdominal pain and diarrhea.  Musculoskeletal: Positive for myalgias.  Skin: Negative for rash.  Neurological: Positive for headaches.  Hematological: Negative for adenopathy.       Objective:   Physical Exam  Constitutional: She appears well-developed and well-nourished. No distress.  HENT:  Mouth/Throat: Oropharynx is clear and moist. No oropharyngeal exudate.  Eyes: Right eye exhibits no discharge. Left eye exhibits no discharge. No scleral icterus.  Cardiovascular: Regular rhythm and normal heart sounds.   No murmur heard. tachycardic  Pulmonary/Chest: Effort normal and breath sounds normal. No respiratory distress. She has no wheezes. She has no rales.  Abdominal: Soft. Bowel sounds are normal. She exhibits no distension. There is no tenderness. There is no rebound.  Musculoskeletal: She exhibits no edema.  Lymphadenopathy:    She has no cervical adenopathy.  Skin: Skin is warm and dry. No rash noted. No  erythema.          Assessment & Plan:

## 2013-08-03 NOTE — Patient Instructions (Signed)
Take Tylenol for fever.  Ibuprofen 2 tabs 3 times a day for muscle aches.   Keep hydrated.

## 2013-10-17 ENCOUNTER — Other Ambulatory Visit: Payer: BC Managed Care – PPO

## 2013-10-17 DIAGNOSIS — B20 Human immunodeficiency virus [HIV] disease: Secondary | ICD-10-CM

## 2013-10-17 DIAGNOSIS — Z113 Encounter for screening for infections with a predominantly sexual mode of transmission: Secondary | ICD-10-CM

## 2013-10-17 DIAGNOSIS — Z79899 Other long term (current) drug therapy: Secondary | ICD-10-CM

## 2013-10-17 LAB — CBC WITH DIFFERENTIAL/PLATELET
BASOS PCT: 0 % (ref 0–1)
Basophils Absolute: 0 10*3/uL (ref 0.0–0.1)
EOS ABS: 0.2 10*3/uL (ref 0.0–0.7)
Eosinophils Relative: 4 % (ref 0–5)
HCT: 36.3 % (ref 36.0–46.0)
Hemoglobin: 12.2 g/dL (ref 12.0–15.0)
LYMPHS ABS: 2.3 10*3/uL (ref 0.7–4.0)
Lymphocytes Relative: 48 % — ABNORMAL HIGH (ref 12–46)
MCH: 29.5 pg (ref 26.0–34.0)
MCHC: 33.6 g/dL (ref 30.0–36.0)
MCV: 87.9 fL (ref 78.0–100.0)
Monocytes Absolute: 0.6 10*3/uL (ref 0.1–1.0)
Monocytes Relative: 12 % (ref 3–12)
NEUTROS PCT: 36 % — AB (ref 43–77)
Neutro Abs: 1.7 10*3/uL (ref 1.7–7.7)
PLATELETS: 233 10*3/uL (ref 150–400)
RBC: 4.13 MIL/uL (ref 3.87–5.11)
RDW: 15.2 % (ref 11.5–15.5)
WBC: 4.8 10*3/uL (ref 4.0–10.5)

## 2013-10-17 LAB — COMPREHENSIVE METABOLIC PANEL
ALK PHOS: 69 U/L (ref 39–117)
ALT: 25 U/L (ref 0–35)
AST: 22 U/L (ref 0–37)
Albumin: 3.9 g/dL (ref 3.5–5.2)
BILIRUBIN TOTAL: 0.3 mg/dL (ref 0.2–1.2)
BUN: 13 mg/dL (ref 6–23)
CO2: 26 mEq/L (ref 19–32)
CREATININE: 0.85 mg/dL (ref 0.50–1.10)
Calcium: 9 mg/dL (ref 8.4–10.5)
Chloride: 105 mEq/L (ref 96–112)
Glucose, Bld: 75 mg/dL (ref 70–99)
Potassium: 4.6 mEq/L (ref 3.5–5.3)
Sodium: 139 mEq/L (ref 135–145)
Total Protein: 7.2 g/dL (ref 6.0–8.3)

## 2013-10-17 LAB — LIPID PANEL
Cholesterol: 150 mg/dL (ref 0–200)
HDL: 37 mg/dL — AB (ref >39)
LDL Cholesterol: 81 mg/dL (ref 0–99)
TRIGLYCERIDES: 161 mg/dL — AB (ref <150)
Total CHOL/HDL Ratio: 4.1 Ratio
VLDL: 32 mg/dL (ref 0–40)

## 2013-10-18 ENCOUNTER — Other Ambulatory Visit: Payer: Self-pay | Admitting: Internal Medicine

## 2013-10-18 LAB — T-HELPER CELL (CD4) - (RCID CLINIC ONLY)
CD4 % Helper T Cell: 28 % — ABNORMAL LOW (ref 33–55)
CD4 T Cell Abs: 640 /uL (ref 400–2700)

## 2013-10-18 LAB — RPR

## 2013-10-19 LAB — HIV-1 RNA QUANT-NO REFLEX-BLD
HIV 1 RNA Quant: <20 copies/mL (ref <20)
HIV-1 RNA Quant, Log: <1.3 {Log} (ref <1.30)

## 2013-10-31 ENCOUNTER — Other Ambulatory Visit: Payer: Self-pay | Admitting: Internal Medicine

## 2013-10-31 ENCOUNTER — Ambulatory Visit (INDEPENDENT_AMBULATORY_CARE_PROVIDER_SITE_OTHER): Payer: BC Managed Care – PPO | Admitting: Internal Medicine

## 2013-10-31 ENCOUNTER — Encounter: Payer: Self-pay | Admitting: Internal Medicine

## 2013-10-31 VITALS — Temp 99.2°F | Wt 223.0 lb

## 2013-10-31 DIAGNOSIS — B20 Human immunodeficiency virus [HIV] disease: Secondary | ICD-10-CM | POA: Diagnosis not present

## 2013-10-31 DIAGNOSIS — R0602 Shortness of breath: Secondary | ICD-10-CM | POA: Diagnosis not present

## 2013-10-31 DIAGNOSIS — D259 Leiomyoma of uterus, unspecified: Secondary | ICD-10-CM

## 2013-10-31 DIAGNOSIS — Z23 Encounter for immunization: Secondary | ICD-10-CM

## 2013-10-31 MED ORDER — SELENIUM SULFIDE 2.5 % EX LOTN: 1.0000 "application " | TOPICAL_LOTION | Freq: Every day | CUTANEOUS | Status: DC | PRN

## 2013-10-31 NOTE — Assessment & Plan Note (Signed)
Referral to GYN 

## 2013-10-31 NOTE — Assessment & Plan Note (Signed)
Doing well.  rtc 4 months.  

## 2013-10-31 NOTE — Progress Notes (Signed)
   Subjective:    Patient ID: Paula Williams, female    DOB: [birth year], 43 y.o.   MRN: [id number]  HPI Here for follow up of her HIV.  Has been on Stribild for HIv and denies missed doses.  CD4 640, vl remains undetectable.  Also c/o SOB and wheezing chronically.  Relates a history of chronic fatigue and asking for high dose vitamin prescriptions, particularly vitamin A.  Had a recent hair analysis for heavy metals and was told she has "toxic" levels.     Review of Systems  Constitutional: Positive for fatigue. Negative for unexpected weight change.  Respiratory: Positive for cough, shortness of breath and wheezing.   Gastrointestinal: Negative for diarrhea.  Skin: Negative for rash.  Neurological: Negative for dizziness and light-headedness.  Psychiatric/Behavioral: Positive for sleep disturbance.       Sleeps a lot       Objective:   Physical Exam  Constitutional: She appears well-developed and well-nourished. No distress.  HENT:  Mouth/Throat: No oropharyngeal exudate.  Eyes: No scleral icterus.  Cardiovascular: Normal rate, regular rhythm and normal heart sounds.   No murmur heard. Pulmonary/Chest: Effort normal and breath sounds normal. No respiratory distress. She has no wheezes. She has no rales.  Lymphadenopathy:    She has no cervical adenopathy.  Skin: No rash noted.  Psychiatric:  tearful          Assessment & Plan:

## 2013-10-31 NOTE — Assessment & Plan Note (Signed)
Will refer to PCP to help evaluate.  No wheezing or issues on exam.  ?PFTs.

## 2013-11-21 ENCOUNTER — Ambulatory Visit (INDEPENDENT_AMBULATORY_CARE_PROVIDER_SITE_OTHER): Payer: Medicaid Other | Admitting: Family Medicine

## 2013-11-21 VITALS — BP 132/74 | HR 77 | Temp 98.1°F | Resp 16 | Ht 66.0 in | Wt 223.0 lb

## 2013-11-21 DIAGNOSIS — F411 Generalized anxiety disorder: Secondary | ICD-10-CM | POA: Diagnosis not present

## 2013-11-21 DIAGNOSIS — B20 Human immunodeficiency virus [HIV] disease: Secondary | ICD-10-CM

## 2013-11-21 DIAGNOSIS — R5383 Other fatigue: Secondary | ICD-10-CM

## 2013-11-21 DIAGNOSIS — Z23 Encounter for immunization: Secondary | ICD-10-CM

## 2013-11-21 DIAGNOSIS — Z0000 Encounter for general adult medical examination without abnormal findings: Secondary | ICD-10-CM

## 2013-11-21 DIAGNOSIS — D75A Glucose-6-phosphate dehydrogenase (G6PD) deficiency without anemia: Secondary | ICD-10-CM

## 2013-11-21 DIAGNOSIS — D550 Anemia due to glucose-6-phosphate dehydrogenase [G6PD] deficiency: Secondary | ICD-10-CM | POA: Diagnosis not present

## 2013-11-21 DIAGNOSIS — J4530 Mild persistent asthma, uncomplicated: Secondary | ICD-10-CM

## 2013-11-21 DIAGNOSIS — D259 Leiomyoma of uterus, unspecified: Secondary | ICD-10-CM

## 2013-11-21 DIAGNOSIS — J4520 Mild intermittent asthma, uncomplicated: Secondary | ICD-10-CM

## 2013-11-21 MED ORDER — BECLOMETHASONE DIPROPIONATE 80 MCG/ACT IN AERS: 1.0000 | INHALATION_SPRAY | Freq: Two times a day (BID) | RESPIRATORY_TRACT | Status: DC
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
## 2013-11-22 LAB — HEMOGLOBIN A1C
Hgb A1c MFr Bld: 5.6 % (ref <5.7)
Mean Plasma Glucose: 114 mg/dL (ref <117)

## 2013-11-22 LAB — TSH: TSH: 1.304 u[IU]/mL (ref 0.350–4.500)

## 2013-11-23 ENCOUNTER — Encounter: Payer: Self-pay | Admitting: Family Medicine

## 2013-12-14 ENCOUNTER — Ambulatory Visit (INDEPENDENT_AMBULATORY_CARE_PROVIDER_SITE_OTHER): Payer: Medicaid Other | Admitting: Obstetrics & Gynecology

## 2013-12-14 ENCOUNTER — Other Ambulatory Visit: Payer: Self-pay | Admitting: Internal Medicine

## 2013-12-14 ENCOUNTER — Encounter: Payer: Self-pay | Admitting: Obstetrics & Gynecology

## 2013-12-14 ENCOUNTER — Ambulatory Visit (INDEPENDENT_AMBULATORY_CARE_PROVIDER_SITE_OTHER): Payer: Medicaid Other | Admitting: Family Medicine

## 2013-12-14 ENCOUNTER — Ambulatory Visit (HOSPITAL_COMMUNITY)
Admission: RE | Admit: 2013-12-14 | Discharge: 2013-12-14 | Disposition: A | Discharge status: Home / Self Care | Payer: Medicaid Other | Source: Ambulatory Visit | Attending: Family Medicine | Admitting: Family Medicine

## 2013-12-14 ENCOUNTER — Encounter: Payer: Self-pay | Admitting: Family Medicine

## 2013-12-14 VITALS — BP 158/87 | HR 78 | Temp 98.1°F | Resp 18 | Ht 66.0 in | Wt 217.0 lb

## 2013-12-14 VITALS — BP 141/79 | HR 79 | Ht 66.0 in | Wt 216.4 lb

## 2013-12-14 DIAGNOSIS — M25552 Pain in left hip: Secondary | ICD-10-CM | POA: Insufficient documentation

## 2013-12-14 DIAGNOSIS — M549 Dorsalgia, unspecified: Secondary | ICD-10-CM

## 2013-12-14 DIAGNOSIS — IMO0001 Reserved for inherently not codable concepts without codable children: Secondary | ICD-10-CM

## 2013-12-14 DIAGNOSIS — R030 Elevated blood-pressure reading, without diagnosis of hypertension: Secondary | ICD-10-CM

## 2013-12-14 DIAGNOSIS — D259 Leiomyoma of uterus, unspecified: Secondary | ICD-10-CM | POA: Diagnosis not present

## 2013-12-14 DIAGNOSIS — B20 Human immunodeficiency virus [HIV] disease: Secondary | ICD-10-CM

## 2013-12-14 DIAGNOSIS — M25551 Pain in right hip: Secondary | ICD-10-CM | POA: Diagnosis not present

## 2013-12-14 DIAGNOSIS — M5386 Other specified dorsopathies, lumbar region: Secondary | ICD-10-CM | POA: Insufficient documentation

## 2013-12-14 DIAGNOSIS — J4530 Mild persistent asthma, uncomplicated: Secondary | ICD-10-CM

## 2013-12-14 DIAGNOSIS — M62830 Muscle spasm of back: Secondary | ICD-10-CM | POA: Insufficient documentation

## 2013-12-14 MED ORDER — TRAMADOL HCL 50 MG PO TABS: 100.0000 mg | ORAL_TABLET | Freq: Four times a day (QID) | ORAL | Status: DC | PRN

## 2013-12-14 MED ORDER — TRAMADOL HCL 50 MG PO TABS: 100.0000 mg | ORAL_TABLET | Freq: Three times a day (TID) | ORAL | Status: AC | PRN

## 2013-12-14 MED ORDER — CYCLOBENZAPRINE HCL 5 MG PO TABS: 5.0000 mg | ORAL_TABLET | Freq: Three times a day (TID) | ORAL | Status: DC | PRN

## 2013-12-14 MED ORDER — IBUPROFEN 800 MG PO TABS: 800.0000 mg | ORAL_TABLET | Freq: Three times a day (TID) | ORAL | Status: DC | PRN

## 2013-12-14 NOTE — Patient Instructions (Signed)
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
## 2013-12-14 NOTE — Patient Instructions (Signed)
Back Pain, Adult °Back pain is very common. The pain often gets better over time. The cause of back pain is usually not dangerous. Most people can learn to manage their back pain on their own.  °HOME CARE  °· Stay active. Start with short walks on flat ground if you can. Try to walk farther each day. °· Do not sit, drive, or stand in one place for more than 30 minutes. Do not stay in bed. °· Do not avoid exercise or work. Activity can help your back heal faster. °· Be careful when you bend or lift an object. Bend at your knees, keep the object close to you, and do not twist. °· Sleep on a firm mattress. Lie on your side, and bend your knees. If you lie on your back, put a pillow under your knees. °· Only take medicines as told by your doctor. °· Put ice on the injured area. °¨ Put ice in a plastic bag. °¨ Place a towel between your skin and the bag. °¨ Leave the ice on for 15-20 minutes, 03-04 times a day for the first 2 to 3 days. After that, you can switch between ice and heat packs. °· Ask your doctor about back exercises or massage. °· Avoid feeling anxious or stressed. Find good ways to deal with stress, such as exercise. °GET HELP RIGHT AWAY IF:  °· Your pain does not go away with rest or medicine. °· Your pain does not go away in 1 week. °· You have new problems. °· You do not feel well. °· The pain spreads into your legs. °· You cannot control when you poop (bowel movement) or pee (urinate). °· Your arms or legs feel weak or lose feeling (numbness). °· You feel sick to your stomach (nauseous) or throw up (vomit). °· You have belly (abdominal) pain. °· You feel like you may pass out (faint). °MAKE SURE YOU:  °· Understand these instructions. °· Will watch your condition. °· Will get help right away if you are not doing well or get worse. °Document Released: 07/23/2007 Document Revised: 04/28/2011 Document Reviewed: 06/07/2013 °[employer]® Patient Information ©2015 [employer]. This information is not intended  to replace advice given to you by your health care provider. Make sure you discuss any questions you have with your health care provider. ° °
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
## 2013-12-15 LAB — URINALYSIS, COMPLETE
BILIRUBIN URINE: NEGATIVE
Casts: NONE SEEN
Crystals: NONE SEEN
GLUCOSE, UA: NEGATIVE mg/dL
Hgb urine dipstick: NEGATIVE
Ketones, ur: NEGATIVE mg/dL
LEUKOCYTES UA: NEGATIVE
Nitrite: NEGATIVE
Protein, ur: NEGATIVE mg/dL
SPECIFIC GRAVITY, URINE: 1.019 (ref 1.005–1.030)
SQUAMOUS EPITHELIAL / LPF: NONE SEEN
UROBILINOGEN UA: 0.2 mg/dL (ref 0.0–1.0)
pH: 5 (ref 5.0–8.0)

## 2013-12-15 LAB — ARTHRITIS PANEL
Anti Nuclear Antibody(ANA): NEGATIVE
Sed Rate: 10 mm/hr (ref 0–22)
URIC ACID, SERUM: 6.2 mg/dL (ref 2.4–7.0)

## 2013-12-16 ENCOUNTER — Other Ambulatory Visit: Payer: Self-pay | Admitting: *Deleted

## 2013-12-16 MED ORDER — SELENIUM SULFIDE 2.5 % EX LOTN: 1.0000 "application " | TOPICAL_LOTION | Freq: Every day | CUTANEOUS | Status: DC | PRN

## 2013-12-19 ENCOUNTER — Telehealth: Payer: Self-pay

## 2013-12-19 ENCOUNTER — Encounter: Payer: Self-pay | Admitting: Obstetrics & Gynecology

## 2013-12-19 ENCOUNTER — Ambulatory Visit (HOSPITAL_COMMUNITY)
Admission: RE | Admit: 2013-12-19 | Discharge: 2013-12-19 | Disposition: A | Discharge status: Home / Self Care | Payer: Medicaid Other | Source: Ambulatory Visit | Attending: Obstetrics & Gynecology | Admitting: Obstetrics & Gynecology

## 2013-12-19 DIAGNOSIS — D251 Intramural leiomyoma of uterus: Secondary | ICD-10-CM | POA: Insufficient documentation

## 2013-12-19 DIAGNOSIS — D259 Leiomyoma of uterus, unspecified: Secondary | ICD-10-CM

## 2013-12-19 NOTE — Telephone Encounter (Signed)
-----   Message from Lavonia Drafts, MD sent at 12/19/2013 11:20 AM EST ----- Please call pt. Her sono show multiple small fibroids.  Sheran Luz not do anything at present as they are not causing sx.  Rec f/u in 1 year or sooner prn change in sx.  Thx, clh-S

## 2013-12-19 NOTE — Telephone Encounter (Signed)
Called patient and informed her of results and recommendations. Patient verbalized understanding and gratitude. No questions or concerns.  

## 2013-12-23 ENCOUNTER — Ambulatory Visit (HOSPITAL_COMMUNITY): Payer: Medicaid Other

## 2014-01-02 ENCOUNTER — Emergency Department (HOSPITAL_COMMUNITY)
Admission: EM | Admit: 2014-01-02 | Discharge: 2014-01-02 | Disposition: A | Discharge status: Home / Self Care | Payer: Medicaid Other | Source: Home / Self Care | Attending: Family Medicine | Admitting: Family Medicine

## 2014-01-02 ENCOUNTER — Encounter (HOSPITAL_COMMUNITY): Payer: Self-pay | Admitting: Emergency Medicine

## 2014-01-02 DIAGNOSIS — M5431 Sciatica, right side: Secondary | ICD-10-CM | POA: Diagnosis not present

## 2014-01-02 LAB — POCT URINALYSIS DIP (DEVICE)
Bilirubin Urine: NEGATIVE
Glucose, UA: 100 mg/dL — AB
HGB URINE DIPSTICK: NEGATIVE
Ketones, ur: NEGATIVE mg/dL
Leukocytes, UA: NEGATIVE
NITRITE: NEGATIVE
PROTEIN: NEGATIVE mg/dL
Specific Gravity, Urine: 1.02 (ref 1.005–1.030)
UROBILINOGEN UA: 0.2 mg/dL (ref 0.0–1.0)
pH: 6 (ref 5.0–8.0)

## 2014-01-02 LAB — POCT PREGNANCY, URINE: Preg Test, Ur: NEGATIVE

## 2014-01-02 MED ORDER — METHYLPREDNISOLONE (PAK) 4 MG PO TABS: ORAL_TABLET | ORAL | Status: DC

## 2014-01-02 MED ORDER — KETOROLAC TROMETHAMINE 60 MG/2ML IM SOLN
60.0000 mg | Freq: Once | INTRAMUSCULAR | Status: AC
  Administered 2014-01-02: 60 mg via INTRAMUSCULAR

## 2014-01-02 MED ORDER — KETOROLAC TROMETHAMINE 60 MG/2ML IM SOLN
INTRAMUSCULAR | Status: AC
  Filled 2014-01-02: qty 2

## 2014-01-02 MED ORDER — HYDROCODONE-ACETAMINOPHEN 5-325 MG PO TABS: 1.0000 | ORAL_TABLET | Freq: Four times a day (QID) | ORAL | Status: DC | PRN
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
## 2014-01-02 NOTE — Discharge Instructions (Signed)
Urine studies were normal. Please begin using medications as prescribed as soon as possible. Do not drive if taking Hydrocodone Please follow up at your appointment tomorrow at [facility] at 1:45pm If symptoms become more severe overnight or before your appointment tomorrow, please seek re-evaluation at your nearest Emergency Room.   Sciatica Sciatica is pain, weakness, numbness, or tingling along the path of the sciatic nerve. The nerve starts in the lower back and runs down the back of each leg. The nerve controls the muscles in the lower leg and in the back of the knee, while also providing sensation to the back of the thigh, lower leg, and the sole of your foot. Sciatica is a symptom of another medical condition. For instance, nerve damage or certain conditions, such as a herniated disk or bone spur on the spine, pinch or put pressure on the sciatic nerve. This causes the pain, weakness, or other sensations normally associated with sciatica. Generally, sciatica only affects one side of the body. CAUSES   Herniated or slipped disc.  Degenerative disk disease.  A pain disorder involving the narrow muscle in the buttocks (piriformis syndrome).  Pelvic injury or fracture.  Pregnancy.  Tumor (rare). SYMPTOMS  Symptoms can vary from mild to very severe. The symptoms usually travel from the low back to the buttocks and down the back of the leg. Symptoms can include:  Mild tingling or dull aches in the lower back, leg, or hip.  Numbness in the back of the calf or sole of the foot.  Burning sensations in the lower back, leg, or hip.  Sharp pains in the lower back, leg, or hip.  Leg weakness.  Severe back pain inhibiting movement. These symptoms may get worse with coughing, sneezing, laughing, or prolonged sitting or standing. Also, being overweight may worsen symptoms. DIAGNOSIS  Your caregiver will perform a physical exam to look for common symptoms of sciatica. He or she may ask you to do  certain movements or activities that would trigger sciatic nerve pain. Other tests may be performed to find the cause of the sciatica. These may include:  Blood tests.  X-rays.  Imaging tests, such as an MRI or CT scan. TREATMENT  Treatment is directed at the cause of the sciatic pain. Sometimes, treatment is not necessary and the pain and discomfort goes away on its own. If treatment is needed, your caregiver may suggest:  Over-the-counter medicines to relieve pain.  Prescription medicines, such as anti-inflammatory medicine, muscle relaxants, or narcotics.  Applying heat or ice to the painful area.  Steroid injections to lessen pain, irritation, and inflammation around the nerve.  Reducing activity during periods of pain.  Exercising and stretching to strengthen your abdomen and improve flexibility of your spine. Your caregiver may suggest losing weight if the extra weight makes the back pain worse.  Physical therapy.  Surgery to eliminate what is pressing or pinching the nerve, such as a bone spur or part of a herniated disk. HOME CARE INSTRUCTIONS   Only take over-the-counter or prescription medicines for pain or discomfort as directed by your caregiver.  Apply ice to the affected area for 20 minutes, 3-4 times a day for the first 48-72 hours. Then try heat in the same way.  Exercise, stretch, or perform your usual activities if these do not aggravate your pain.  Attend physical therapy sessions as directed by your caregiver.  Keep all follow-up appointments as directed by your caregiver.  Do not wear high heels or shoes that  do not provide proper support.  Check your mattress to see if it is too soft. A firm mattress may lessen your pain and discomfort. SEEK IMMEDIATE MEDICAL CARE IF:   You lose control of your bowel or bladder (incontinence).  You have increasing weakness in the lower back, pelvis, buttocks, or legs.  You have redness or swelling of your  back.  You have a burning sensation when you urinate.  You have pain that gets worse when you lie down or awakens you at night.  Your pain is worse than you have experienced in the past.  Your pain is lasting longer than 4 weeks.  You are suddenly losing weight without reason. MAKE SURE YOU:  Understand these instructions.  Will watch your condition.  Will get help right away if you are not doing well or get worse. Document Released: 01/28/2001 Document Revised: 08/05/2011 Document Reviewed: 06/15/2011 [facility] Patient Information 2015 [place], [place]. This information is not intended to replace advice given to you by your health care provider. Make sure you discuss any questions you have with your health care provider.

## 2014-01-02 NOTE — ED Notes (Signed)
Right hip /groin pain , long history of the same. Patient prescribed tramadol and muscle relaxers for pain.

## 2014-01-03 ENCOUNTER — Ambulatory Visit: Payer: Medicaid Other | Admitting: Family Medicine

## 2014-01-10 ENCOUNTER — Other Ambulatory Visit: Payer: Self-pay | Admitting: Internal Medicine

## 2014-01-10 DIAGNOSIS — B20 Human immunodeficiency virus [HIV] disease: Secondary | ICD-10-CM

## 2014-02-20 ENCOUNTER — Other Ambulatory Visit: Payer: Medicaid Other

## 2014-02-21 ENCOUNTER — Other Ambulatory Visit (INDEPENDENT_AMBULATORY_CARE_PROVIDER_SITE_OTHER): Payer: Medicaid Other

## 2014-02-21 ENCOUNTER — Other Ambulatory Visit: Payer: Self-pay | Admitting: Family Medicine

## 2014-02-21 DIAGNOSIS — Z8639 Personal history of other endocrine, nutritional and metabolic disease: Secondary | ICD-10-CM | POA: Diagnosis not present

## 2014-02-21 DIAGNOSIS — Z0000 Encounter for general adult medical examination without abnormal findings: Secondary | ICD-10-CM

## 2014-02-21 LAB — LIPID PANEL
Cholesterol: 170 mg/dL (ref 0–200)
HDL: 39 mg/dL — AB (ref >39)
LDL Cholesterol: 104 mg/dL — ABNORMAL HIGH (ref 0–99)
TRIGLYCERIDES: 135 mg/dL (ref <150)
Total CHOL/HDL Ratio: 4.4 Ratio
VLDL: 27 mg/dL (ref 0–40)

## 2014-02-22 ENCOUNTER — Ambulatory Visit (INDEPENDENT_AMBULATORY_CARE_PROVIDER_SITE_OTHER): Payer: Medicaid Other | Admitting: Family Medicine

## 2014-02-22 VITALS — BP 137/91 | HR 103 | Temp 97.9°F | Resp 18 | Ht 66.0 in | Wt 215.0 lb

## 2014-02-22 DIAGNOSIS — M5386 Other specified dorsopathies, lumbar region: Secondary | ICD-10-CM | POA: Diagnosis not present

## 2014-02-22 DIAGNOSIS — M62830 Muscle spasm of back: Secondary | ICD-10-CM

## 2014-02-22 DIAGNOSIS — B20 Human immunodeficiency virus [HIV] disease: Secondary | ICD-10-CM

## 2014-02-22 DIAGNOSIS — M5431 Sciatica, right side: Secondary | ICD-10-CM

## 2014-02-22 DIAGNOSIS — M549 Dorsalgia, unspecified: Secondary | ICD-10-CM

## 2014-02-22 DIAGNOSIS — G8929 Other chronic pain: Secondary | ICD-10-CM

## 2014-02-22 DIAGNOSIS — Z8639 Personal history of other endocrine, nutritional and metabolic disease: Secondary | ICD-10-CM

## 2014-02-22 DIAGNOSIS — B081 Molluscum contagiosum: Secondary | ICD-10-CM

## 2014-02-22 MED ORDER — HYDROCODONE-ACETAMINOPHEN 5-325 MG PO TABS: 1.0000 | ORAL_TABLET | ORAL | Status: DC | PRN

## 2014-02-22 MED ORDER — SELENIUM SULFIDE 2.5 % EX LOTN: 1.0000 "application " | TOPICAL_LOTION | Freq: Every day | CUTANEOUS | Status: DC | PRN

## 2014-02-22 MED ORDER — KETOROLAC TROMETHAMINE 30 MG/ML IJ SOLN
30.0000 mg | Freq: Once | INTRAMUSCULAR | Status: AC
  Administered 2014-02-22: 30 mg via INTRAMUSCULAR

## 2014-02-22 MED ORDER — CYCLOBENZAPRINE HCL 5 MG PO TABS: 5.0000 mg | ORAL_TABLET | Freq: Three times a day (TID) | ORAL | Status: DC | PRN
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
## 2014-02-23 ENCOUNTER — Other Ambulatory Visit: Payer: Self-pay

## 2014-02-23 ENCOUNTER — Encounter: Payer: Self-pay | Admitting: Family Medicine

## 2014-02-23 DIAGNOSIS — G8929 Other chronic pain: Secondary | ICD-10-CM | POA: Insufficient documentation

## 2014-02-23 DIAGNOSIS — M62830 Muscle spasm of back: Secondary | ICD-10-CM

## 2014-02-23 DIAGNOSIS — M549 Dorsalgia, unspecified: Secondary | ICD-10-CM

## 2014-02-23 DIAGNOSIS — B081 Molluscum contagiosum: Secondary | ICD-10-CM

## 2014-02-23 MED ORDER — SELENIUM SULFIDE 2.5 % EX LOTN: 1.0000 "application " | TOPICAL_LOTION | Freq: Every day | CUTANEOUS | Status: DC | PRN

## 2014-02-23 MED ORDER — CYCLOBENZAPRINE HCL 5 MG PO TABS: 5.0000 mg | ORAL_TABLET | Freq: Three times a day (TID) | ORAL | Status: DC | PRN

## 2014-02-23 NOTE — Telephone Encounter (Signed)
Sent rx's to correct pharmacy. Advised patient to come in tomorrow for CBC. Thanks!

## 2014-02-27 ENCOUNTER — Ambulatory Visit (INDEPENDENT_AMBULATORY_CARE_PROVIDER_SITE_OTHER): Payer: Medicaid Other | Admitting: Family Medicine

## 2014-02-27 ENCOUNTER — Ambulatory Visit (HOSPITAL_COMMUNITY)
Admission: RE | Admit: 2014-02-27 | Discharge: 2014-02-27 | Disposition: A | Discharge status: Home / Self Care | Payer: Medicaid Other | Source: Ambulatory Visit | Attending: Family Medicine | Admitting: Family Medicine

## 2014-02-27 VITALS — BP 145/80 | HR 79 | Temp 97.9°F | Resp 16 | Ht 66.0 in | Wt 218.0 lb

## 2014-02-27 DIAGNOSIS — Z0000 Encounter for general adult medical examination without abnormal findings: Secondary | ICD-10-CM | POA: Diagnosis not present

## 2014-02-27 DIAGNOSIS — M545 Low back pain: Secondary | ICD-10-CM | POA: Diagnosis present

## 2014-02-27 DIAGNOSIS — G8929 Other chronic pain: Secondary | ICD-10-CM

## 2014-02-27 DIAGNOSIS — M25551 Pain in right hip: Secondary | ICD-10-CM | POA: Insufficient documentation

## 2014-02-27 DIAGNOSIS — M5126 Other intervertebral disc displacement, lumbar region: Secondary | ICD-10-CM | POA: Insufficient documentation

## 2014-02-27 DIAGNOSIS — M549 Dorsalgia, unspecified: Secondary | ICD-10-CM

## 2014-02-27 LAB — CBC WITH DIFFERENTIAL/PLATELET
BASOS PCT: 0 % (ref 0–1)
Basophils Absolute: 0 10*3/uL (ref 0.0–0.1)
EOS PCT: 7 % — AB (ref 0–5)
Eosinophils Absolute: 0.3 10*3/uL (ref 0.0–0.7)
HCT: 37.4 % (ref 36.0–46.0)
Hemoglobin: 12.4 g/dL (ref 12.0–15.0)
LYMPHS ABS: 2.3 10*3/uL (ref 0.7–4.0)
Lymphocytes Relative: 50 % — ABNORMAL HIGH (ref 12–46)
MCH: 28.9 pg (ref 26.0–34.0)
MCHC: 33.2 g/dL (ref 30.0–36.0)
MCV: 87.2 fL (ref 78.0–100.0)
MPV: 11 fL (ref 8.6–12.4)
Monocytes Absolute: 0.5 10*3/uL (ref 0.1–1.0)
Monocytes Relative: 10 % (ref 3–12)
NEUTROS PCT: 33 % — AB (ref 43–77)
Neutro Abs: 1.5 10*3/uL — ABNORMAL LOW (ref 1.7–7.7)
Platelets: 260 10*3/uL (ref 150–400)
RBC: 4.29 MIL/uL (ref 3.87–5.11)
RDW: 14.3 % (ref 11.5–15.5)
WBC: 4.6 10*3/uL (ref 4.0–10.5)

## 2014-02-27 LAB — COMPLETE METABOLIC PANEL WITH GFR
ALT: 19 U/L (ref 0–35)
AST: 20 U/L (ref 0–37)
Albumin: 3.9 g/dL (ref 3.5–5.2)
Alkaline Phosphatase: 77 U/L (ref 39–117)
BILIRUBIN TOTAL: 0.3 mg/dL (ref 0.2–1.2)
BUN: 10 mg/dL (ref 6–23)
CHLORIDE: 102 meq/L (ref 96–112)
CO2: 27 mEq/L (ref 19–32)
Calcium: 9 mg/dL (ref 8.4–10.5)
Creat: 0.78 mg/dL (ref 0.50–1.10)
GFR, Est Non African American: >89 mL/min
Glucose, Bld: 82 mg/dL (ref 70–99)
Potassium: 4.4 mEq/L (ref 3.5–5.3)
SODIUM: 137 meq/L (ref 135–145)
TOTAL PROTEIN: 7.2 g/dL (ref 6.0–8.3)

## 2014-02-27 LAB — TSH: TSH: 0.152 u[IU]/mL — ABNORMAL LOW (ref 0.350–4.500)

## 2014-02-27 NOTE — Progress Notes (Signed)
Patient ID: Paula Williams, female   DOB: [birth year], 44 y.o.   MRN: [id number]   HPI    ANNUAL PREVENTATIVE VISIT AND CPE  Subjective:  Paula Williams is a 44 y.o. female w who presents for Annual Wellness Visit and complete physical.  Date of last wellness visit is unknown.  She does not workout. She denies chest pain, shortness of breath, dizziness.  She is not on cholesterol medication.  Her cholesterol is not at goal. The cholesterol last visit was:   Lab Results  Component Value Date   CHOL 170 02/21/2014   HDL 39* 02/21/2014   LDLCALC 104* 02/21/2014   TRIG 135 02/21/2014   CHOLHDL 4.4 02/21/2014   She does not have a history of diabetes.   She has not been working on diet and exercise for prediabetes, and denies foot ulcerations, nausea, polydipsia, polyuria, visual disturbances, vomiting and weight loss. Last A1C in the office was:  Lab Results  Component Value Date   HGBA1C 5.6 11/21/2013   Patient is on Vitamin D supplement.   Lab Results  Component Value Date   VD25OH 41 02/27/2014       Names of Other Physician/Practitioners you currently use: 1. [facility] here for primary care 2. Last dental visit was 6 months ago Patient Care Team: Leana Gamer, MD as PCP - General (Internal Medicine) Thayer Headings, MD as PCP - Infectious Diseases (Infectious Diseases) Thayer Headings, MD (Infectious Diseases)   Medication Review: Current Outpatient Prescriptions on File Prior to Visit  Medication Sig Dispense Refill  . albuterol (PROVENTIL HFA;VENTOLIN HFA) 108 (90 BASE) MCG/ACT inhaler Inhale 2 puffs into the lungs every 6 (six) hours as needed for wheezing or shortness of breath. 1 Inhaler 2  . Ascorbic Acid (VITAMIN C PO) Take 1 tablet by mouth daily.    . beclomethasone (QVAR) 80 MCG/ACT inhaler Inhale 1 puff into the lungs 2 (two) times daily. 1 Inhaler 12  . calcium-vitamin D (OSCAL WITH D) 500-200 MG-UNIT per tablet Take 1 tablet by mouth  daily.    . Cholecalciferol (VITAMIN D) 1000 UNITS capsule Take 1 capsule (1,000 Units total) by mouth daily. 30 capsule 0  . cyclobenzaprine (FLEXERIL) 5 MG tablet Take 1 tablet (5 mg total) by mouth 3 (three) times daily as needed for muscle spasms. 30 tablet 0  . HYDROcodone-acetaminophen (NORCO/VICODIN) 5-325 MG per tablet Take 1 tablet by mouth every 4 (four) hours as needed for moderate pain or severe pain. 30 tablet 0  . ibuprofen (ADVIL,MOTRIN) 800 MG tablet Take 1 tablet (800 mg total) by mouth every 8 (eight) hours as needed for moderate pain. 30 tablet 0  . loratadine (CLARITIN) 10 MG tablet Take 10 mg by mouth daily as needed for allergies.    Marland Kitchen selenium sulfide (SELSUN) 2.5 % shampoo Apply 1 application topically daily as needed for irritation. 118 mL 0  . STRIBILD 150-150-200-300 MG TABS tablet TAKE 1 TABLET BY MOUTH DAILY WITH BREAKFAST 30 tablet 5  . VITAMIN A PO Take 1 tablet by mouth daily.     No current facility-administered medications on file prior to visit.    Current Problems (verified) Patient Active Problem List   Diagnosis Date Noted  . Chronic back pain greater than 3 months duration 02/23/2014  . Acute back pain 12/14/2013  . Decreased range of motion of intervertebral discs of lumbar spine 12/14/2013  . Back spasm 12/14/2013  . SOB (shortness of breath) 10/31/2013  .  HIV disease 08/19/2012  . Anxiety state 08/19/2012  . History of BCG vaccination 08/11/2012  . Fibroids 08/03/2012  . 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 Asthma, chronic 08/03/2012  . G6PD deficiency 08/03/2012  . 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 Intramural leiomyoma of uterus 08/03/2012  . Molluscum contagiosum 08/03/2012    Screening Tests Health Maintenance  Topic Date Due  . INFLUENZA VACCINE  09/18/2014  . PAP SMEAR  01/18/2016  . TETANUS/TDAP  02/17/2017    Immunization History  Administered Date(s) Administered  . Influenza Split 12/22/2011  . Influenza,inj,Quad PF,36+  Mos 12/30/2012, 10/31/2013  . Pneumococcal Polysaccharide-23 11/18/2007, 08/10/2012, 11/21/2013  . Tdap 02/18/2007    Preventative care: Last colonoscopy: Never Last mammogram: 3 years ago; she refuses mammogram Last pap smear/pelvic exam: January 2014   .    Medication List       This list is accurate as of: 02/27/14 11:59 PM.  Always use your most recent med list.               albuterol 108 (90 BASE) MCG/ACT inhaler  Commonly known as:  PROVENTIL HFA;VENTOLIN HFA  Inhale 2 puffs into the lungs every 6 (six) hours as needed for wheezing or shortness of breath.     beclomethasone 80 MCG/ACT inhaler  Commonly known as:  QVAR  Inhale 1 puff into the lungs 2 (two) times daily.     calcium-vitamin D 500-200 MG-UNIT per tablet  Commonly known as:  OSCAL WITH D  Take 1 tablet by mouth daily.     cyclobenzaprine 5 MG tablet  Commonly known as:  FLEXERIL  Take 1 tablet (5 mg total) by mouth 3 (three) times daily as needed for muscle spasms.     HYDROcodone-acetaminophen 5-325 MG per tablet  Commonly known as:  NORCO/VICODIN  Take 1 tablet by mouth every 4 (four) hours as needed for moderate pain or severe pain.     ibuprofen 800 MG tablet  Commonly known as:  ADVIL,MOTRIN  Take 1 tablet (800 mg total) by mouth every 8 (eight) hours as needed for moderate pain.     loratadine 10 MG tablet  Commonly known as:  CLARITIN  Take 10 mg by mouth daily as needed for allergies.     selenium sulfide 2.5 % shampoo  Commonly known as:  SELSUN  Apply 1 application topically daily as needed for irritation.     STRIBILD 150-150-200-300 MG Tabs tablet  Generic drug:  elvitegravir-cobicistat-emtricitabine-tenofovir  TAKE 1 TABLET BY MOUTH DAILY WITH BREAKFAST     VITAMIN A PO  Take 1 tablet by mouth daily.     VITAMIN C PO  Take 1 tablet by mouth daily.     Vitamin D 1000 UNITS capsule  Take 1 capsule (1,000 Units total) by mouth daily.        Past Surgical History   Procedure Laterality Date  . Cesarean section  11/2001  . Colposcopy  2003    for abnormal Pap smear being CIN I   Family History  Problem Relation Age of Onset  . Diabetes Mother   . Hypertension Father     History reviewed: allergies, current medications, past family history, past medical history, past social history, past surgical history and problem list   Risk Factors: Osteoporosis/FallRisk:  In the past year have you fallen or had a near fall?:No History of fracture in the past year:No Tobacco History  Substance Use Topics  . Smoking status: Never Smoker   . Smokeless  tobacco: Never Used  . Alcohol Use: No   She does not smoke.  Patient is not a former smoker. Are there smokers in your home?  No  Alcohol Current alcohol use: none  Caffeine Current caffeine use: denies use  Exercise Current exercise: none  Nutrition/Diet Current diet: in general, a "healthy" diet    Cardiac risk factors: obesity (BMI >= 30 kg/m2) and sedentary lifestyle.  Depression Screen (Note: if answer to either of the following is "Yes", a more complete depression screening is indicated)   Over the past two weeks, have you felt down, depressed or hopeless? No  Over the past two weeks, have you felt little interest or pleasure in doing things? No  Have you lost interest or pleasure in daily life? No  Do you often feel hopeless? No  Do you cry easily over simple problems?No  Activities of Daily Living In your present state of health, do you have any difficulty performing the following activities?:  Driving?No Managing money?No Feeding yourself? {No Getting from bed to chair? No Climbing a flight of stairs? Yes at times Preparing food and eating?: No Bathing or showering? No Getting dressed: No Getting to the toilet? No Moving around from place to place? No In the past year have you fallen or had a near fall?:No  Are you sexually active? No  Do you have more than one partner?   No Vision Difficulties: None identified  Hearing Difficulties:  Do you often ask people to speak up or repeat themselves? No Do you experience ringing or noises in your ears? No  Do you have difficulty understanding soft or whispered voices? No  Cognition  Do you feel that you have a problem with memory?No  Do you often misplace items? No  Do you feel safe at home? Yes  Advanced directives Does patient have a [place]?No Does patient have a Living Will? No   Objective:     Blood pressure 145/80, pulse 79, temperature 97.9 F (36.6 C), temperature source Oral, resp. rate 16, height 5\' 6"  (1.676 m), weight 218 lb (98.884 kg), last menstrual period 02/18/2014. Body mass index is 35.2 kg/(m^2).  General appearance: alert, no distress, WD/WN, female   Assessment:  Patient denies any difficulties at home. No trouble with ADLs, depression or falls. No recent changes to vision or hearing. Is UTD with immunizations. Is UTD with screening. Discussed Advanced Directives, patient agrees to bring [place] copies of documents if can. Encouraged heart healthy diet, exercise as tolerated and adequate sleep. Declines flu shot. Pap smear done today.     Plan:   During the course of the visit the patient was educated and counseled about appropriate screening and preventive services including:    Vaccinations reviewed  Screening electrocardiogram  Bone densitometry screening  Diabetes screening  Nutrition counseling   Advanced directives: requested  Screening recommendations, referrals: Vaccinations: Please see documentation below and orders this visit.  Nutrition assessed and recommended  Colonoscopy not indicated Recommended yearly ophthalmology/optometry visit for glaucoma screening and checkup Recommended yearly dental visit for hygiene and checkup Advanced directives - requested  Conditions/risks identified: BMI: Discussed weight loss, diet, and increase  physical activity.  Increase physical activity: AHA recommends 150 minutes of physical activity a week.  Medications reviewed Fall risk: Low   Review of Systems Review of Systems  Constitutional: Negative.   HENT: Negative.   Eyes: Positive for redness.  Respiratory: Negative.   Cardiovascular: Negative.  Negative for palpitations and  leg swelling.  Endocrine: Negative.   Genitourinary: Negative.   Musculoskeletal: Positive for back pain (history of chronic back pain).  Skin: Negative.   Allergic/Immunologic: Negative.   Neurological: Negative.   Hematological: Negative.     Blood pressure 145/80, pulse 79, temperature 97.9 F (36.6 C), temperature source Oral, resp. rate 16, height 5\' 6"  (1.676 m), weight 218 lb (98.884 kg).  Physical Exam Physical Exam  Constitutional: She is oriented to person, place, and time. She appears well-developed and well-nourished.  HENT:  Head: Normocephalic and atraumatic.  Right Ear: External ear normal.  Left Ear: External ear normal.  Mouth/Throat: Oropharynx is clear and moist.  Eyes: Conjunctivae and EOM are normal. Pupils are equal, round, and reactive to light.  Neck: Normal range of motion. Neck supple.  Cardiovascular: Normal rate, normal heart sounds and intact distal pulses.   Pulmonary/Chest: Effort normal and breath sounds normal.  Abdominal: Soft. Bowel sounds are normal.  Musculoskeletal:       Lumbar back: She exhibits pain. She exhibits normal range of motion, no swelling and no edema.  Neurological: She is alert and oriented to person, place, and time. She has normal reflexes.  Skin: Skin is warm and dry.  Psychiatric: She has a normal mood and affect. Her behavior is normal. Judgment and thought content normal.     Medicare Attestation I have personally reviewed: The patient's medical and social history Their use of alcohol, tobacco or illicit drugs Their current medications and supplements The patient's functional  ability including ADLs,fall risks, home safety risks, cognitive, and hearing and visual impairment Diet and physical activities Evidence for depression or mood disorders  The patient's weight, height, BMI, and visual acuity have been recorded in the chart.  I have made referrals, counseling, and provided education to the patient based on review of the above and I have provided the patient with a written personalized care plan for preventive services.     Eryn Marandola Jerilynn Mages, FNP   02/27/2014

## 2014-02-28 LAB — VITAMIN D 25 HYDROXY (VIT D DEFICIENCY, FRACTURES)
VIT D 25 HYDROXY: 38 ng/mL (ref 30–100)
VIT D 25 HYDROXY: 41 ng/mL (ref 30–100)

## 2014-03-01 ENCOUNTER — Encounter: Payer: Self-pay | Admitting: Family Medicine

## 2014-03-02 ENCOUNTER — Ambulatory Visit (INDEPENDENT_AMBULATORY_CARE_PROVIDER_SITE_OTHER): Payer: Medicaid Other | Admitting: Internal Medicine

## 2014-03-02 ENCOUNTER — Encounter: Payer: Self-pay | Admitting: Internal Medicine

## 2014-03-02 VITALS — BP 117/80 | HR 87 | Temp 98.8°F | Wt 217.0 lb

## 2014-03-02 DIAGNOSIS — B20 Human immunodeficiency virus [HIV] disease: Secondary | ICD-10-CM | POA: Diagnosis present

## 2014-03-02 NOTE — Progress Notes (Signed)
   Subjective:    Patient ID: Paula Williams, female    DOB: 10-01-1970, 44 y.o.   MRN: [id number]  HPI Here for follow up of her HIV.  Has been on Stribild for HIv and denies missed doses.  CD4 640, vl remains undetectable last visit, but no labs prior to this visit.  Has established with a primary physician. Has had back pain and MRI done by them with some disc protrusion.   Review of Systems  Constitutional: Negative for fatigue and unexpected weight change.  HENT: Negative for trouble swallowing.   Gastrointestinal: Negative for diarrhea.  Skin: Negative for rash.  Neurological: Negative for dizziness and light-headedness.  Psychiatric/Behavioral:       Sleeps a lot       Objective:   Physical Exam  Constitutional: She appears well-developed and well-nourished. No distress.  HENT:  Mouth/Throat: No oropharyngeal exudate.  Eyes: No scleral icterus.  Cardiovascular: Normal rate, regular rhythm and normal heart sounds.   No murmur heard. Pulmonary/Chest: Effort normal and breath sounds normal. No respiratory distress. She has no wheezes. She has no rales.  Lymphadenopathy:    She has no cervical adenopathy.  Skin: No rash noted.          Assessment & Plan:

## 2014-03-02 NOTE — Assessment & Plan Note (Signed)
She is doing great. He can return in about 4 months with labs before. I will do labs today and she will call if there is any issues.

## 2014-03-03 LAB — HIV-1 RNA QUANT-NO REFLEX-BLD: HIV-1 RNA Quant, Log: <1.3 {Log} (ref <1.30)

## 2014-03-03 LAB — T-HELPER CELL (CD4) - (RCID CLINIC ONLY)
CD4 % Helper T Cell: 26 % — ABNORMAL LOW (ref 33–55)
CD4 T Cell Abs: 570 /uL (ref 400–2700)

## 2014-04-24 ENCOUNTER — Ambulatory Visit (INDEPENDENT_AMBULATORY_CARE_PROVIDER_SITE_OTHER): Payer: Medicaid Other | Admitting: Family Medicine

## 2014-04-24 VITALS — BP 144/83 | HR 82 | Temp 98.1°F | Resp 16 | Ht 66.0 in | Wt 217.0 lb

## 2014-04-24 DIAGNOSIS — B20 Human immunodeficiency virus [HIV] disease: Secondary | ICD-10-CM

## 2014-04-24 DIAGNOSIS — N912 Amenorrhea, unspecified: Secondary | ICD-10-CM
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
## 2014-04-25 ENCOUNTER — Encounter: Payer: Self-pay | Admitting: Family Medicine

## 2014-04-25 ENCOUNTER — Telehealth: Payer: Self-pay | Admitting: Family Medicine

## 2014-04-25 LAB — PREGNANCY, URINE: Preg Test, Ur: NEGATIVE

## 2014-04-25 NOTE — Telephone Encounter (Signed)
Reviewed laboratory results and notified patient with pregnancy test results. Advised patient to use barrier protection with sexual intercourse. Discussed peri menopause at length.   Dorena Dew, FNP

## 2014-04-27 ENCOUNTER — Encounter: Payer: Self-pay | Admitting: Family Medicine

## 2014-04-28 ENCOUNTER — Other Ambulatory Visit: Payer: Self-pay | Admitting: Family Medicine

## 2014-04-28 DIAGNOSIS — G8929 Other chronic pain: Secondary | ICD-10-CM

## 2014-04-28 DIAGNOSIS — M549 Dorsalgia, unspecified: Principal | ICD-10-CM

## 2014-06-22 ENCOUNTER — Other Ambulatory Visit: Payer: Medicaid Other

## 2014-06-22 DIAGNOSIS — B20 Human immunodeficiency virus [HIV] disease: Secondary | ICD-10-CM

## 2014-06-23 LAB — HIV-1 RNA QUANT-NO REFLEX-BLD

## 2014-06-23 LAB — T-HELPER CELL (CD4) - (RCID CLINIC ONLY)
CD4 T CELL ABS: 600 /uL (ref 400–2700)
CD4 T CELL HELPER: 30 % — AB (ref 33–55)

## 2014-07-06 ENCOUNTER — Ambulatory Visit: Payer: Medicaid Other | Admitting: Internal Medicine

## 2014-07-19 ENCOUNTER — Other Ambulatory Visit: Payer: Self-pay | Admitting: Internal Medicine

## 2014-07-19 DIAGNOSIS — B20 Human immunodeficiency virus [HIV] disease: Secondary | ICD-10-CM

## 2014-07-25 ENCOUNTER — Ambulatory Visit: Payer: Medicaid Other | Admitting: Family Medicine

## 2014-07-25 ENCOUNTER — Ambulatory Visit (INDEPENDENT_AMBULATORY_CARE_PROVIDER_SITE_OTHER): Payer: Medicaid Other | Admitting: Internal Medicine

## 2014-07-25 ENCOUNTER — Encounter: Payer: Self-pay | Admitting: Internal Medicine

## 2014-07-25 VITALS — BP 134/83 | HR 76 | Temp 98.2°F | Ht 66.0 in | Wt 215.0 lb

## 2014-07-25 DIAGNOSIS — Z113 Encounter for screening for infections with a predominantly sexual mode of transmission: Secondary | ICD-10-CM

## 2014-07-25 DIAGNOSIS — B20 Human immunodeficiency virus [HIV] disease: Secondary | ICD-10-CM

## 2014-07-25 MED ORDER — ELVITEG-COBIC-EMTRICIT-TENOFAF 150-150-200-10 MG PO TABS: 1.0000 | ORAL_TABLET | Freq: Every day | ORAL | Status: DC

## 2014-07-25 NOTE — Assessment & Plan Note (Signed)
Doing great.  Will change to genvoya and she can rtc in 4 months.

## 2014-07-25 NOTE — Progress Notes (Signed)
   Subjective:    Patient ID: Paula Williams, female    DOB: 1971/01/10, 44 y.o.   MRN: [id number]  HPI Here for follow up of her HIV.  Has been on Stribild for HIV and denies missed doses.  CD4 600, vl remains undetectable.  No new issues.     Review of Systems  Constitutional: Negative for fatigue and unexpected weight change.  HENT: Negative for trouble swallowing.   Gastrointestinal: Negative for diarrhea.  Skin: Negative for rash.  Neurological: Negative for dizziness and light-headedness.       Objective:   Physical Exam  Constitutional: She appears well-developed and well-nourished. No distress.  HENT:  Mouth/Throat: No oropharyngeal exudate.  Eyes: No scleral icterus.  Cardiovascular: Normal rate, regular rhythm and normal heart sounds.   No murmur heard. Pulmonary/Chest: Effort normal and breath sounds normal. No respiratory distress.  Lymphadenopathy:    She has no cervical adenopathy.  Skin: No rash noted.          Assessment & Plan:
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
## 2014-08-01 ENCOUNTER — Encounter: Payer: Self-pay | Admitting: Family Medicine

## 2014-08-01 ENCOUNTER — Ambulatory Visit (INDEPENDENT_AMBULATORY_CARE_PROVIDER_SITE_OTHER): Payer: Medicaid Other | Admitting: Family Medicine

## 2014-08-01 VITALS — BP 142/82 | HR 84 | Temp 97.8°F | Resp 14 | Ht 66.0 in | Wt 214.0 lb

## 2014-08-01 DIAGNOSIS — M549 Dorsalgia, unspecified: Secondary | ICD-10-CM | POA: Diagnosis not present

## 2014-08-01 DIAGNOSIS — G8929 Other chronic pain: Secondary | ICD-10-CM | POA: Diagnosis not present

## 2014-08-01 DIAGNOSIS — M62830 Muscle spasm of back: Secondary | ICD-10-CM

## 2014-08-01 MED ORDER — KETOROLAC TROMETHAMINE 30 MG/ML IJ SOLN: 30.0000 mg | Freq: Once | INTRAMUSCULAR | Status: DC

## 2014-08-01 MED ORDER — OXYCODONE-ACETAMINOPHEN 5-325 MG PO TABS: 1.0000 | ORAL_TABLET | Freq: Four times a day (QID) | ORAL | Status: DC | PRN

## 2014-08-01 MED ORDER — ACETAMINOPHEN 500 MG PO TABS: 500.0000 mg | ORAL_TABLET | Freq: Four times a day (QID) | ORAL | Status: DC | PRN

## 2014-08-01 MED ORDER — CYCLOBENZAPRINE HCL 5 MG PO TABS: 5.0000 mg | ORAL_TABLET | Freq: Three times a day (TID) | ORAL | Status: DC | PRN

## 2014-08-01 MED ORDER — KETOROLAC TROMETHAMINE 30 MG/ML IJ SOLN
30.0000 mg | Freq: Once | INTRAMUSCULAR | Status: AC
  Administered 2014-08-01: 30 mg via INTRAMUSCULAR
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
## 2014-08-01 NOTE — Patient Instructions (Signed)

## 2014-08-02 ENCOUNTER — Other Ambulatory Visit: Payer: Self-pay

## 2014-08-02 DIAGNOSIS — M62830 Muscle spasm of back: Secondary | ICD-10-CM

## 2014-08-02 MED ORDER — CYCLOBENZAPRINE HCL 5 MG PO TABS: 5.0000 mg | ORAL_TABLET | Freq: Three times a day (TID) | ORAL | Status: DC | PRN

## 2014-08-02 NOTE — Telephone Encounter (Signed)
medicaiton sent to correct pharmacy. Thanks!

## 2014-08-23 ENCOUNTER — Other Ambulatory Visit: Payer: Self-pay

## 2014-08-23 DIAGNOSIS — J4520 Mild intermittent asthma, uncomplicated: Secondary | ICD-10-CM

## 2014-08-23 MED ORDER — ALBUTEROL SULFATE HFA 108 (90 BASE) MCG/ACT IN AERS: 2.0000 | INHALATION_SPRAY | Freq: Four times a day (QID) | RESPIRATORY_TRACT | Status: AC | PRN

## 2014-08-23 NOTE — Telephone Encounter (Signed)
Refill for pro-air sent into pharmacy. Thanks!  

## 2014-11-03 ENCOUNTER — Ambulatory Visit: Payer: Medicaid Other | Admitting: Family Medicine

## 2014-11-06 ENCOUNTER — Ambulatory Visit (INDEPENDENT_AMBULATORY_CARE_PROVIDER_SITE_OTHER): Payer: Medicaid Other | Admitting: Family Medicine

## 2014-11-06 ENCOUNTER — Encounter: Payer: Self-pay | Admitting: Family Medicine

## 2014-11-06 VITALS — BP 128/80 | HR 78 | Temp 98.1°F | Resp 16 | Ht 66.0 in | Wt 218.0 lb

## 2014-11-06 DIAGNOSIS — R631 Polydipsia: Secondary | ICD-10-CM

## 2014-11-06 DIAGNOSIS — R42 Dizziness and giddiness: Secondary | ICD-10-CM

## 2014-11-06 DIAGNOSIS — Z131 Encounter for screening for diabetes mellitus: Secondary | ICD-10-CM | POA: Diagnosis not present

## 2014-11-06 DIAGNOSIS — B20 Human immunodeficiency virus [HIV] disease: Secondary | ICD-10-CM

## 2014-11-06 DIAGNOSIS — R3589 Other polyuria: Secondary | ICD-10-CM

## 2014-11-06 DIAGNOSIS — R358 Other polyuria: Secondary | ICD-10-CM | POA: Diagnosis not present

## 2014-11-06 DIAGNOSIS — B081 Molluscum contagiosum: Secondary | ICD-10-CM

## 2014-11-06 LAB — COMPLETE METABOLIC PANEL WITH GFR
ALT: 14 U/L (ref 6–29)
AST: 15 U/L (ref 10–35)
Albumin: 3.9 g/dL (ref 3.6–5.1)
Alkaline Phosphatase: 71 U/L (ref 33–115)
BUN: 15 mg/dL (ref 7–25)
CHLORIDE: 103 mmol/L (ref 98–110)
CO2: 24 mmol/L (ref 20–31)
CREATININE: 0.95 mg/dL (ref 0.50–1.10)
Calcium: 8.8 mg/dL (ref 8.6–10.2)
GFR, Est African American: 83 mL/min (ref >=60)
GFR, Est Non African American: 72 mL/min (ref >=60)
Glucose, Bld: 107 mg/dL — ABNORMAL HIGH (ref 65–99)
Potassium: 4.3 mmol/L (ref 3.5–5.3)
SODIUM: 139 mmol/L (ref 135–146)
Total Bilirubin: 0.3 mg/dL (ref 0.2–1.2)
Total Protein: 7.1 g/dL (ref 6.1–8.1)

## 2014-11-06 LAB — POCT URINALYSIS DIP (DEVICE)
Bilirubin Urine: NEGATIVE
Glucose, UA: NEGATIVE mg/dL
Hgb urine dipstick: NEGATIVE
KETONES UR: NEGATIVE mg/dL
Leukocytes, UA: NEGATIVE
Nitrite: NEGATIVE
PH: 5 (ref 5.0–8.0)
PROTEIN: NEGATIVE mg/dL
SPECIFIC GRAVITY, URINE: 1.015 (ref 1.005–1.030)
Urobilinogen, UA: 0.2 mg/dL (ref 0.0–1.0)

## 2014-11-06 LAB — GLUCOSE, CAPILLARY: GLUCOSE-CAPILLARY: 113 mg/dL — AB (ref 65–99)

## 2014-11-06 MED ORDER — SELENIUM SULFIDE 2.5 % EX LOTN: 1.0000 "application " | TOPICAL_LOTION | Freq: Every day | CUTANEOUS | Status: DC | PRN

## 2014-11-06 NOTE — Patient Instructions (Signed)
Screening for Type 2 Diabetes Screening is a way to check for type 2 diabetes in people who do not have symptoms of the disease, but who may likely develop diabetes in the future. Diabetes can lead to serious health problems, but finding diabetes early allows for early treatment. DIABETES RISK FACTORS   Family history of diabetes.  Diseases of the pancreas.  Obesity or being overweight.  Certain racial or ethnic groups:  American [place].  Pacific Islander.  Hispanic.  Asian.  African American.  High blood pressure (hypertension).  History of diabetes while pregnant (gestational diabetes).  Delivering a baby that weighed over 9 pounds.  Being inactive.  High cholesterol or triglycerides.  Age, especially over 2 years of age. WHO IS SCREENED Adults  Adults who have no risk factors and no symptoms should be screened starting at age 52. If the screening tests are normal, they should be repeated every 3 years.  Adults who do not have symptoms, but are overweight, should be screened before age 58.  Adults who do not have symptoms, but have 1 or more risk factors, should be screened.  Adults who have an A1c (3 month average of blood glucose) greater than 5.7% or who had an impaired glucose tolerance (IGT) or impaired fasting glucose (IFG) on a previous test should be screened.  Pregnant women with or without risk factors should be screened.  Women who gave birth and had gestational diabetes should be screened. This testing should be done 6 to 12 weeks after the child is born. Children or Adolescents  Children and adolescents should be screened for type 2 diabetes if they are overweight and have 2 of the following risk factors:  Having a family history of type 2 diabetes.  Being a member of a high risk race or ethnic group.  Having signs of insulin resistance or conditions associated with insulin resistance.  Having a mother who had gestational diabetes while  pregnant with him or her.  Screening should start at age 12 or at the onset of puberty, whichever comes first. This should be repeated every 2 years. SCREENING In a screening, your caregiver may:  Ask questions about your overall health. This will include questions about the health of close family members, too.  Ask about any diabetes-like symptoms you may have.  Perform a physical exam.  Order some tests that may include:  A fasting plasma glucose test. This measures the level of glucose in your blood. It is done after you have had nothing to eat but water (fasted) for 8 hours.  A random blood glucose test. This test is done without the need to fast.  An oral glucose tolerance test. This is a blood test done in 2 parts. First, a blood sample is taken after you have fasted. Then, another sample is taken after you drink a liquid that contains a lot of sugar.  An A1c test. This test shows how much glucose has been in your blood over the past 2 to 3 months. Document Released: 11/30/2008 Document Revised: 04/28/2011 Document Reviewed: 09/11/2010 [facility] Patient Information 2015 [place], [place]. This information is not intended to replace advice given to you by your health care Paula Williams. Make sure you discuss any questions you have with your health care Paula Williams.
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
## 2014-11-07 LAB — HEMOGLOBIN A1C
HEMOGLOBIN A1C: 5.7 % — AB (ref <5.7)
Mean Plasma Glucose: 117 mg/dL — ABNORMAL HIGH (ref <117)

## 2014-11-13 ENCOUNTER — Encounter: Payer: Self-pay | Admitting: Family Medicine

## 2014-11-13 ENCOUNTER — Ambulatory Visit (INDEPENDENT_AMBULATORY_CARE_PROVIDER_SITE_OTHER): Payer: Medicaid Other | Admitting: Family Medicine

## 2014-11-13 VITALS — BP 126/73 | HR 75 | Temp 98.1°F | Resp 16 | Ht 66.0 in | Wt 213.0 lb

## 2014-11-13 DIAGNOSIS — R7303 Prediabetes: Secondary | ICD-10-CM | POA: Insufficient documentation

## 2014-11-13 DIAGNOSIS — R7309 Other abnormal glucose: Secondary | ICD-10-CM

## 2014-11-13 DIAGNOSIS — Z87898 Personal history of other specified conditions: Secondary | ICD-10-CM | POA: Diagnosis not present
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
## 2014-11-13 NOTE — Patient Instructions (Addendum)
Recommend a yearly eye examination ([facility], [facility], etc)Basic Carbohydrate Counting for Diabetes Mellitus Carbohydrate counting is a method for keeping track of the amount of carbohydrates you eat. Eating carbohydrates naturally increases the level of sugar (glucose) in your blood, so it is important for you to know the amount that is okay for you to have in every meal. Carbohydrate counting helps keep the level of glucose in your blood within normal limits. The amount of carbohydrates allowed is different for every person. A dietitian can help you calculate the amount that is right for you. Once you know the amount of carbohydrates you can have, you can count the carbohydrates in the foods you want to eat. Carbohydrates are found in the following foods:  Grains, such as breads and cereals.  Dried beans and soy products.  Starchy vegetables, such as potatoes, peas, and corn.  Fruit and fruit juices.  Milk and yogurt.  Sweets and snack foods, such as cake, cookies, candy, chips, soft drinks, and fruit drinks. CARBOHYDRATE COUNTING There are two ways to count the carbohydrates in your food. You can use either of the methods or a combination of both. Reading the "Nutrition Facts" on [place] The "Nutrition Facts" is an area that is included on the labels of almost all packaged food and beverages in the [place]. It includes the serving size of that food or beverage and information about the nutrients in each serving of the food, including the grams (g) of carbohydrate per serving.  Decide the number of servings of this food or beverage that you will be able to eat or drink. Multiply that number of servings by the number of grams of carbohydrate that is listed on the label for that serving. The total will be the amount of carbohydrates you will be having when you eat or drink this food or beverage. Learning Standard Serving Sizes of Food When you eat food that is not packaged or  does not include "Nutrition Facts" on the label, you need to measure the servings in order to count the amount of carbohydrates.A serving of most carbohydrate-rich foods contains about 15 g of carbohydrates. The following list includes serving sizes of carbohydrate-rich foods that provide 15 g ofcarbohydrate per serving:   1 slice of bread (1 oz) or 1 six-inch tortilla.    of a hamburger bun or English muffin.  4-6 crackers.   cup unsweetened dry cereal.    cup hot cereal.   cup rice or pasta.    cup mashed potatoes or  of a large baked potato.  1 cup fresh fruit or one small piece of fruit.    cup canned or frozen fruit or fruit juice.  1 cup milk.   cup plain fat-free yogurt or yogurt sweetened with artificial sweeteners.   cup cooked dried beans or starchy vegetable, such as peas, corn, or potatoes.  Decide the number of standard-size servings that you will eat. Multiply that number of servings by 15 (the grams of carbohydrates in that serving). For example, if you eat 2 cups of strawberries, you will have eaten 2 servings and 30 g of carbohydrates (2 servings x 15 g = 30 g). For foods such as soups and casseroles, in which more than one food is mixed in, you will need to count the carbohydrates in each food that is included. EXAMPLE OF CARBOHYDRATE COUNTING Sample Dinner  3 oz chicken breast.   cup of brown rice.   cup of corn.  1 cup  milk.   1 cup strawberries with sugar-free whipped topping.  Carbohydrate Calculation Step 1: Identify the foods that contain carbohydrates:   Rice.   Corn.   Milk.   Strawberries. Step 2:Calculate the number of servings eaten of each:   2 servings of rice.   1 serving of corn.   1 serving of milk.   1 serving of strawberries. Step 3: Multiply each of those number of servings by 15 g:   2 servings of rice x 15 g = 30 g.   1 serving of corn x 15 g = 15 g.   1 serving of milk x 15 g = 15 g.    1 serving of strawberries x 15 g = 15 g. Step 4: Add together all of the amounts to find the total grams of carbohydrates eaten: 30 g + 15 g + 15 g + 15 g = 75 g. Document Released: 02/03/2005 Document Revised: 06/20/2013 Document Reviewed: 12/31/2012 [facility] Patient Information 2015 [place], [place]. This information is not intended to replace advice given to you by your health care provider. Make sure you discuss any questions you have with your health care provider. Fat and Cholesterol Control Diet Fat and cholesterol levels in your blood and organs are influenced by your diet. High levels of fat and cholesterol may lead to diseases of the heart, small and large blood vessels, gallbladder, liver, and pancreas. CONTROLLING FAT AND CHOLESTEROL WITH DIET Although exercise and lifestyle factors are important, your diet is key. That is because certain foods are known to raise cholesterol and others to lower it. The goal is to balance foods for their effect on cholesterol and more importantly, to replace saturated and trans fat with other types of fat, such as monounsaturated fat, polyunsaturated fat, and omega-3 fatty acids. On average, a person should consume no more than 15 to 17 g of saturated fat daily. Saturated and trans fats are considered "bad" fats, and they will raise LDL cholesterol. Saturated fats are primarily found in animal products such as meats, butter, and cream. However, that does not mean you need to give up all your favorite foods. Today, there are good tasting, low-fat, low-cholesterol substitutes for most of the things you like to eat. Choose low-fat or nonfat alternatives. Choose round or loin cuts of red meat. These types of cuts are lowest in fat and cholesterol. Chicken (without the skin), fish, veal, and ground [place] breast are great choices. Eliminate fatty meats, such as hot dogs and salami. Even shellfish have little or no saturated fat. Have a 3 oz (85 g) portion when you  eat lean meat, poultry, or fish. Trans fats are also called "partially hydrogenated oils." They are oils that have been scientifically manipulated so that they are solid at room temperature resulting in a longer shelf life and improved taste and texture of foods in which they are added. Trans fats are found in stick margarine, some tub margarines, cookies, crackers, and baked goods.  When baking and cooking, oils are a great substitute for butter. The monounsaturated oils are especially beneficial since it is believed they lower LDL and raise HDL. The oils you should avoid entirely are saturated tropical oils, such as coconut and palm.  Remember to eat a lot from food groups that are naturally free of saturated and trans fat, including fish, fruit, vegetables, beans, grains (barley, rice, couscous, bulgur wheat), and pasta (without cream sauces).  IDENTIFYING FOODS THAT LOWER FAT AND CHOLESTEROL  Soluble fiber may  lower your cholesterol. This type of fiber is found in fruits such as apples, vegetables such as broccoli, potatoes, and carrots, legumes such as beans, peas, and lentils, and grains such as barley. Foods fortified with plant sterols (phytosterol) may also lower cholesterol. You should eat at least 2 g per day of these foods for a cholesterol lowering effect.  Read package labels to identify low-saturated fats, trans fat free, and low-fat foods at the supermarket. Select cheeses that have only 2 to 3 g saturated fat per ounce. Use a heart-healthy tub margarine that is free of trans fats or partially hydrogenated oil. When buying baked goods (cookies, crackers), avoid partially hydrogenated oils. Breads and muffins should be made from whole grains (whole-wheat or whole oat flour, instead of "flour" or "enriched flour"). Buy non-creamy canned soups with reduced salt and no added fats.  FOOD PREPARATION TECHNIQUES  Never deep-fry. If you must fry, either stir-fry, which uses very little fat, or use  non-stick cooking sprays. When possible, broil, bake, or roast meats, and steam vegetables. Instead of putting butter or margarine on vegetables, use lemon and herbs, applesauce, and cinnamon (for squash and sweet potatoes). Use nonfat yogurt, salsa, and low-fat dressings for salads.  LOW-SATURATED FAT / LOW-FAT FOOD SUBSTITUTES Meats / Saturated Fat (g)  Avoid: Steak, marbled (3 oz/85 g) / 11 g  Choose: Steak, lean (3 oz/85 g) / 4 g  Avoid: Hamburger (3 oz/85 g) / 7 g  Choose: Hamburger, lean (3 oz/85 g) / 5 g  Avoid: Ham (3 oz/85 g) / 6 g  Choose: Ham, lean cut (3 oz/85 g) / 2.4 g  Avoid: Chicken, with skin, dark meat (3 oz/85 g) / 4 g  Choose: Chicken, skin removed, dark meat (3 oz/85 g) / 2 g  Avoid: Chicken, with skin, light meat (3 oz/85 g) / 2.5 g  Choose: Chicken, skin removed, light meat (3 oz/85 g) / 1 g Dairy / Saturated Fat (g)  Avoid: Whole milk (1 cup) / 5 g  Choose: Low-fat milk, 2% (1 cup) / 3 g  Choose: Low-fat milk, 1% (1 cup) / 1.5 g  Choose: Skim milk (1 cup) / 0.3 g  Avoid: Hard cheese (1 oz/28 g) / 6 g  Choose: Skim milk cheese (1 oz/28 g) / 2 to 3 g  Avoid: Cottage cheese, 4% fat (1 cup) / 6.5 g  Choose: Low-fat cottage cheese, 1% fat (1 cup) / 1.5 g  Avoid: Ice cream (1 cup) / 9 g  Choose: Sherbet (1 cup) / 2.5 g  Choose: Nonfat frozen yogurt (1 cup) / 0.3 g  Choose: Frozen fruit bar / trace  Avoid: Whipped cream (1 tbs) / 3.5 g  Choose: Nondairy whipped topping (1 tbs) / 1 g Condiments / Saturated Fat (g)  Avoid: Mayonnaise (1 tbs) / 2 g  Choose: Low-fat mayonnaise (1 tbs) / 1 g  Avoid: Butter (1 tbs) / 7 g  Choose: Extra light margarine (1 tbs) / 1 g  Avoid: Coconut oil (1 tbs) / 11.8 g  Choose: Olive oil (1 tbs) / 1.8 g  Choose: Corn oil (1 tbs) / 1.7 g  Choose: Safflower oil (1 tbs) / 1.2 g  Choose: Sunflower oil (1 tbs) / 1.4 g  Choose: Soybean oil (1 tbs) / 2.4 g  Choose: Canola oil (1 tbs) / 1 g Document  Released: 02/03/2005 Document Revised: 05/31/2012 Document Reviewed: 05/04/2013 [employer] Patient Information 2015 [place], [place]. This information is not intended to replace  advice given to you by your health care provider. Make sure you discuss any questions you have with your health care provider. Serving Sizes What we call a serving size today is larger than it was in the past. A 1950s fast-food burger contained little more than 1 oz of meat, and a soft drink was 8 oz (1 cup). Today, a "quarter pounder" burger is at least 4 times that amount, and a 32 or 64 oz drink is not uncommon. A possible guide for eating when trying to lose weight is to eat about half as much as you normally do. Some estimates of serving sizes are:  1 Dairy serving:Individual container of yogurt (8 oz) or piece of cheese the size of your thumb (1 oz).  1 Grain serving: 1 slice of bread or  cup pasta.  1 Meat serving: The size of a deck of cards (3 oz).  1 Fruit serving: cup canned fruit or 1 medium fruit.  1 Vegetable serving:  cup of cooked or canned vegetables.  1 Fat serving:The size of 4 stacked dimes. Experts suggest spending 1 or 2 days measuring food portions you commonly eat. This will give you better practice at estimating serving sizes, and will also show whether you are eating an appropriate amount of food to meet your weight goals. If you find that you are eating more than you thought, try measuring your food for a few days so you can "reprogram" yourself to learn what makes a healthy portion for you. SUGGESTIONS FOR CONTROL  In restaurants, share entrees, or ask the waiter to put half the entre in a box or bag before you even touch it.  Order lunch-sized portions. Many restaurants serve 4 to 6 oz of meat at lunch, compared with 8 to 10 oz at dinner.  Split dessert or skip it all together. Have a piece of fruit when you get home.  At home, use smaller plates and bowls. It will look as if you are  eating more.  Plate your food in the kitchen rather than serving it "family style" at the table.  Wait 20 to 30 minutes before taking seconds. This is how long it takes your brain to recognize that you are full.  Check food labels for serving sizes. Eat 1 serving only.  Use measuring cups and spoons to see proper serving sizes.  Buy smaller packages of candy, popcorn, and snacks.  Avoid eating directly out of the bag or carton.  While eating half as much, exercise twice as much. Park further away from the mall, take the stairs instead of the escalator, and walk around your block. Losing weight is a slow, difficult process. It takes long-lasting lifestyle changes. You can make gradual changes over time so they become habits. Look to friends and family to support the healthy changes you are making. Avoid fad diets since they are often only temporary weight loss solutions. Document Released: 11/02/2002 Document Revised: 04/28/2011 Document Reviewed: 05/03/2013 [facility] Patient Information 2015 [place], [place]. This information is not intended to replace advice given to you by your health care provider. Make sure you discuss any questions you have with your health care provider.

## 2014-11-16 ENCOUNTER — Other Ambulatory Visit (HOSPITAL_COMMUNITY)
Admission: RE | Admit: 2014-11-16 | Discharge: 2014-11-16 | Disposition: A | Discharge status: Home / Self Care | Payer: Medicaid Other | Source: Ambulatory Visit | Attending: Internal Medicine | Admitting: Internal Medicine

## 2014-11-16 ENCOUNTER — Other Ambulatory Visit: Payer: Medicaid Other

## 2014-11-16 DIAGNOSIS — B20 Human immunodeficiency virus [HIV] disease: Secondary | ICD-10-CM

## 2014-11-16 DIAGNOSIS — Z113 Encounter for screening for infections with a predominantly sexual mode of transmission: Secondary | ICD-10-CM | POA: Diagnosis not present

## 2014-11-16 LAB — CBC WITH DIFFERENTIAL/PLATELET
BASOS ABS: 0 10*3/uL (ref 0.0–0.1)
Basophils Relative: 0 % (ref 0–1)
EOS ABS: 0.3 10*3/uL (ref 0.0–0.7)
EOS PCT: 8 % — AB (ref 0–5)
HCT: 39 % (ref 36.0–46.0)
Hemoglobin: 13 g/dL (ref 12.0–15.0)
Lymphocytes Relative: 50 % — ABNORMAL HIGH (ref 12–46)
Lymphs Abs: 1.8 10*3/uL (ref 0.7–4.0)
MCH: 29 pg (ref 26.0–34.0)
MCHC: 33.3 g/dL (ref 30.0–36.0)
MCV: 86.9 fL (ref 78.0–100.0)
MPV: 11 fL (ref 8.6–12.4)
Monocytes Absolute: 0.3 10*3/uL (ref 0.1–1.0)
Monocytes Relative: 9 % (ref 3–12)
Neutro Abs: 1.2 10*3/uL — ABNORMAL LOW (ref 1.7–7.7)
Neutrophils Relative %: 33 % — ABNORMAL LOW (ref 43–77)
PLATELETS: 186 10*3/uL (ref 150–400)
RBC: 4.49 MIL/uL (ref 3.87–5.11)
RDW: 15.3 % (ref 11.5–15.5)
WBC: 3.6 10*3/uL — AB (ref 4.0–10.5)

## 2014-11-16 LAB — COMPLETE METABOLIC PANEL WITH GFR
ALK PHOS: 75 U/L (ref 33–115)
ALT: 16 U/L (ref 6–29)
AST: 16 U/L (ref 10–35)
Albumin: 3.8 g/dL (ref 3.6–5.1)
BILIRUBIN TOTAL: 0.3 mg/dL (ref 0.2–1.2)
BUN: 12 mg/dL (ref 7–25)
CALCIUM: 9 mg/dL (ref 8.6–10.2)
CO2: 24 mmol/L (ref 20–31)
CREATININE: 0.96 mg/dL (ref 0.50–1.10)
Chloride: 104 mmol/L (ref 98–110)
GFR, EST AFRICAN AMERICAN: 82 mL/min (ref >=60)
GFR, Est Non African American: 71 mL/min (ref >=60)
Glucose, Bld: 114 mg/dL — ABNORMAL HIGH (ref 65–99)
Potassium: 4.3 mmol/L (ref 3.5–5.3)
Sodium: 137 mmol/L (ref 135–146)
TOTAL PROTEIN: 7.2 g/dL (ref 6.1–8.1)

## 2014-11-16 LAB — RPR

## 2014-11-17 LAB — HIV-1 RNA QUANT-NO REFLEX-BLD
HIV 1 RNA Quant: 37 copies/mL — ABNORMAL HIGH (ref <20)
HIV-1 RNA Quant, Log: 1.57 {Log} — ABNORMAL HIGH (ref <1.30)

## 2014-11-17 LAB — URINE CYTOLOGY ANCILLARY ONLY
Chlamydia: NEGATIVE
Neisseria Gonorrhea: NEGATIVE

## 2014-11-17 LAB — T-HELPER CELL (CD4) - (RCID CLINIC ONLY)
CD4 T CELL ABS: 550 /uL (ref 400–2700)
CD4 T CELL HELPER: 30 % — AB (ref 33–55)

## 2014-11-22 ENCOUNTER — Telehealth: Payer: Self-pay | Admitting: *Deleted

## 2014-11-22 NOTE — Telephone Encounter (Signed)
Patient called and asked if we could get Medicaid to pay for an extra 30 days of medication as she is leaving the country 11/28/14 until 01/06/15 and she just had a refill 11/14/14. Advised the patient we have never been able to get Medicaid to do a refill for an extra 30 days of medication for travel due to expense. She advised she has had it done before and does not understand why we cant do it now. Asked her how she was able to get it and she advised she called the pharmacy and arranged something. Advised her to do the same this time. Advised her to call us back if she needs a paper Rx or anything.

## 2014-11-30 ENCOUNTER — Ambulatory Visit: Payer: Medicaid Other | Admitting: Internal Medicine

## 2015-01-02 ENCOUNTER — Encounter: Payer: Self-pay | Admitting: Family Medicine

## 2015-01-02 ENCOUNTER — Emergency Department (HOSPITAL_COMMUNITY)
Admission: EM | Admit: 2015-01-02 | Discharge: 2015-01-02 | Disposition: A | Discharge status: Home / Self Care | Payer: Medicaid Other | Attending: Emergency Medicine | Admitting: Emergency Medicine

## 2015-01-02 ENCOUNTER — Ambulatory Visit (INDEPENDENT_AMBULATORY_CARE_PROVIDER_SITE_OTHER): Payer: Medicaid Other | Admitting: Family Medicine

## 2015-01-02 VITALS — BP 149/87 | HR 79 | Temp 97.6°F | Resp 18 | Ht 66.0 in | Wt 218.0 lb

## 2015-01-02 DIAGNOSIS — L509 Urticaria, unspecified: Secondary | ICD-10-CM

## 2015-01-02 DIAGNOSIS — M62830 Muscle spasm of back: Secondary | ICD-10-CM | POA: Insufficient documentation

## 2015-01-02 DIAGNOSIS — R21 Rash and other nonspecific skin eruption: Secondary | ICD-10-CM

## 2015-01-02 DIAGNOSIS — M5386 Other specified dorsopathies, lumbar region: Secondary | ICD-10-CM

## 2015-01-02 DIAGNOSIS — T39395A Adverse effect of other nonsteroidal anti-inflammatory drugs [NSAID], initial encounter: Secondary | ICD-10-CM | POA: Insufficient documentation

## 2015-01-02 DIAGNOSIS — M25551 Pain in right hip: Secondary | ICD-10-CM | POA: Insufficient documentation

## 2015-01-02 DIAGNOSIS — Z86711 Personal history of pulmonary embolism: Secondary | ICD-10-CM | POA: Insufficient documentation

## 2015-01-02 DIAGNOSIS — Z79899 Other long term (current) drug therapy: Secondary | ICD-10-CM | POA: Diagnosis not present

## 2015-01-02 DIAGNOSIS — M549 Dorsalgia, unspecified: Secondary | ICD-10-CM

## 2015-01-02 DIAGNOSIS — G8929 Other chronic pain: Secondary | ICD-10-CM | POA: Insufficient documentation

## 2015-01-02 DIAGNOSIS — Z8669 Personal history of other diseases of the nervous system and sense organs: Secondary | ICD-10-CM | POA: Diagnosis not present

## 2015-01-02 DIAGNOSIS — Z21 Asymptomatic human immunodeficiency virus [HIV] infection status: Secondary | ICD-10-CM | POA: Diagnosis not present

## 2015-01-02 DIAGNOSIS — Z8619 Personal history of other infectious and parasitic diseases: Secondary | ICD-10-CM | POA: Diagnosis not present

## 2015-01-02 DIAGNOSIS — B20 Human immunodeficiency virus [HIV] disease: Secondary | ICD-10-CM

## 2015-01-02 DIAGNOSIS — T7840XA Allergy, unspecified, initial encounter: Secondary | ICD-10-CM

## 2015-01-02 DIAGNOSIS — L500 Allergic urticaria: Secondary | ICD-10-CM | POA: Diagnosis not present

## 2015-01-02 DIAGNOSIS — Z9889 Other specified postprocedural states: Secondary | ICD-10-CM | POA: Insufficient documentation

## 2015-01-02 LAB — POCT URINALYSIS DIP (DEVICE)
BILIRUBIN URINE: NEGATIVE
Glucose, UA: NEGATIVE mg/dL
KETONES UR: NEGATIVE mg/dL
Leukocytes, UA: NEGATIVE
NITRITE: NEGATIVE
PH: 6 (ref 5.0–8.0)
PROTEIN: NEGATIVE mg/dL
SPECIFIC GRAVITY, URINE: 1.025 (ref 1.005–1.030)
UROBILINOGEN UA: 0.2 mg/dL (ref 0.0–1.0)

## 2015-01-02 MED ORDER — CYCLOBENZAPRINE HCL 5 MG PO TABS: 5.0000 mg | ORAL_TABLET | Freq: Three times a day (TID) | ORAL | Status: DC | PRN

## 2015-01-02 MED ORDER — DIPHENHYDRAMINE HCL 25 MG PO TABS: 25.0000 mg | ORAL_TABLET | ORAL | Status: DC | PRN

## 2015-01-02 MED ORDER — FAMOTIDINE IN NACL 20-0.9 MG/50ML-% IV SOLN
20.0000 mg | Freq: Once | INTRAVENOUS | Status: AC
  Administered 2015-01-02: 20 mg via INTRAVENOUS
  Filled 2015-01-02: qty 50

## 2015-01-02 MED ORDER — TRAMADOL HCL 50 MG PO TABS: 50.0000 mg | ORAL_TABLET | Freq: Four times a day (QID) | ORAL | Status: DC | PRN

## 2015-01-02 MED ORDER — DIPHENHYDRAMINE HCL 50 MG/ML IJ SOLN
50.0000 mg | Freq: Once | INTRAMUSCULAR | Status: AC
  Administered 2015-01-02: 50 mg via INTRAVENOUS
  Filled 2015-01-02: qty 1

## 2015-01-02 MED ORDER — METHYLPREDNISOLONE SODIUM SUCC 125 MG IJ SOLR
125.0000 mg | Freq: Once | INTRAMUSCULAR | Status: AC
  Administered 2015-01-02: 125 mg via INTRAVENOUS
  Filled 2015-01-02: qty 2

## 2015-01-02 MED ORDER — KETOROLAC TROMETHAMINE 30 MG/ML IJ SOLN
60.0000 mg | Freq: Once | INTRAMUSCULAR | Status: AC
  Administered 2015-01-02: 60 mg via INTRAMUSCULAR

## 2015-01-02 MED ORDER — SODIUM CHLORIDE 0.9 % IV BOLUS (SEPSIS)
1000.0000 mL | Freq: Once | INTRAVENOUS | Status: AC
Start: 2015-01-02 — End: 2015-01-02
  Administered 2015-01-02: 1000 mL via INTRAVENOUS

## 2015-01-02 MED ORDER — RANITIDINE HCL 150 MG PO TABS: 150.0000 mg | ORAL_TABLET | Freq: Two times a day (BID) | ORAL | Status: DC

## 2015-01-02 MED ORDER — PREDNISONE 20 MG PO TABS: ORAL_TABLET | ORAL | Status: DC
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
## 2015-01-02 NOTE — Patient Instructions (Signed)
Toradol injection received in office without complication.  Will Start Tramadol 50 mg every 6 hours as needed for moderate to severe back pain. Will also start Cyclobenzaprine 5 mg every 8 hours as needed for muscle spasms.   Please refrain from drinking alcohol, driving, or operating machinery by taking medication.   Apply warm, moist compresses to lower back 20 minutes 4 times per day as needed for right hip/low back pain

## 2015-01-02 NOTE — ED Notes (Signed)
Pt reports she received an injection of Toradol around 230. Itching started an hour ago. Pt has hives to whole body, hands are red. Pt face is diaphoretic. Denies shortness of breath.
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
## 2015-01-02 NOTE — Discharge Instructions (Signed)
Take Prednisone, Benadryl, and zantac as prescribed. Continue your usual home medications. Get plenty of rest and drink plenty of fluids. Avoid any known triggers. Please followup with your primary doctor in 3 days for discussion of your diagnoses and further evaluation after today's visit. For your back pain, use tylenol or aleve as needed, use heat over the area of pain, and follow up with your regular doctor. Return to the ER for changes or worsening symptoms.   Allergies An allergy is when your body reacts to a substance in a way that is not normal. An allergic reaction can happen after you:  Eat something.  Breathe in something.  Touch something. WHAT KINDS OF ALLERGIES ARE THERE? You can be allergic to:  Things that are only around during certain seasons, like molds and pollens.  Foods.  Drugs.  Insects.  Animal dander. WHAT ARE SYMPTOMS OF ALLERGIES?  Puffiness (swelling). This may happen on the lips, face, tongue, mouth, or throat.  Sneezing.  Coughing.  Breathing loudly (wheezing).  Stuffy nose.  Tingling in the mouth.  A rash.  Itching.  Itchy, red, puffy areas of skin (hives).  Watery eyes.  Throwing up (vomiting).  Watery poop (diarrhea).  Dizziness.  Feeling faint or fainting.  Trouble breathing or swallowing.  A tight feeling in the chest.  A fast heartbeat. HOW ARE ALLERGIES DIAGNOSED? Allergies can be diagnosed with:  A medical and family history.  Skin tests.  Blood tests.  A food diary. A food diary is a record of all the foods, drinks, and symptoms you have each day.  The results of an elimination diet. This diet involves making sure not to eat certain foods and then seeing what happens when you start eating them again. HOW ARE ALLERGIES TREATED? There is no cure for allergies, but allergic reactions can be treated with medicine. Severe reactions usually need to be treated at a hospital.  HOW CAN REACTIONS BE PREVENTED? The  best way to prevent an allergic reaction is to avoid the thing you are allergic to. Allergy shots and medicines can also help prevent reactions in some cases.   This information is not intended to replace advice given to you by your health care provider. Make sure you discuss any questions you have with your health care provider.   Document Released: 05/31/2012 Document Revised: 02/24/2014 Document Reviewed: 11/15/2013 Elsevier Interactive Patient Education 2016 [place] are itchy, red, puffy (swollen) areas of the skin. Hives can change in size and location on your body. Hives can come and go for hours, days, or weeks. Hives do not spread from person to person (noncontagious). Scratching, exercise, and stress can make your hives worse. HOME CARE  Avoid things that cause your hives (triggers).  Take antihistamine medicines as told by your doctor. Do not drive while taking an antihistamine.  Take any other medicines for itching as told by your doctor.  Wear loose-fitting clothing.  Keep all doctor visits as told. GET HELP RIGHT AWAY IF:   You have a fever.  Your tongue or lips are puffy.  You have trouble breathing or swallowing.  You feel tightness in the throat or chest.  You have belly (abdominal) pain.  You have lasting or severe itching that is not helped by medicine.  You have painful or puffy joints. These problems may be the first sign of a life-threatening allergic reaction. Call your local emergency services (911 in [place] MAKE SURE YOU:   Understand these instructions.  Will watch your condition.  Will get help right away if you are not doing well or get worse.   This information is not intended to replace advice given to you by your health care provider. Make sure you discuss any questions you have with your health care provider.   Document Released: 11/13/2007 Document Revised: 08/05/2011 Document Reviewed: 04/29/2011 Elsevier Interactive  Patient Education [employer].

## 2015-01-02 NOTE — ED Notes (Signed)
Pt stable, ambulatory, states understanding of discharge instructions 

## 2015-01-08 ENCOUNTER — Encounter: Payer: Self-pay | Admitting: Dietician

## 2015-01-08 ENCOUNTER — Encounter: Payer: Medicaid Other | Attending: Internal Medicine | Admitting: Dietician

## 2015-01-08 VITALS — Ht 66.0 in | Wt 213.0 lb

## 2015-01-08 DIAGNOSIS — Z713 Dietary counseling and surveillance: Secondary | ICD-10-CM | POA: Diagnosis not present

## 2015-01-08 DIAGNOSIS — R7303 Prediabetes: Secondary | ICD-10-CM | POA: Diagnosis not present

## 2015-01-08 NOTE — Patient Instructions (Signed)
Continue your exercise classes.  This is a great habit! Begin to be mindful about your eating choices, portion sizes and appetite. Breakfast, Lunch, Dinner daily and small snacks if you are hungry. When you are eating avoid distractions.  Ask am I hungry or am I eating for another reason. Aim for 3 carbohydrate choices with each meal  (45 grams carbohydrate).   Choose a small portion of protein with each meal. Consider adding an egg or nuts to breakfast with steel cut oats or multigrain unsweetened cereal and fruit.

## 2015-01-08 NOTE — Progress Notes (Signed)
Diabetes Self-Management Education  Visit Type: First/Initial  Appt. Start Time: 1400 Appt. End Time: 1530  01/08/2015  [name], identified by name and date of birth, is a 44 y.o. female with a diagnosis of Diabetes: Pre-Diabetes.   Other hx includes HIV.  She has darkening of the skin on the elbows and mild on the neck.  She lives with her uncle and 67 yo son.  She is from [place] and is unemployed.  She has a catering degree.  She does the shopping and cooking.  She recently moved here from [place].    Weight hx:  213 lbs current, high of 218 lbs, low adult weight 200 lbs.  She would like to lose weight and is concerned about her health due to her weight.      ASSESSMENT  Height 5\' 6"  (1.676 m), weight 213 lb (96.616 kg). Body mass index is 34.4 kg/(m^2).      Diabetes Self-Management Education - 01/08/15 1420    Visit Information   Visit Type First/Initial   Initial Visit   Diabetes Type Pre-Diabetes   Are you currently following a meal plan? No   Are you taking your medications as prescribed? Not on Medications   Date Diagnosed September 2016   Health Coping   How would you rate your overall health? Good   Psychosocial Assessment   Patient Belief/Attitude about Diabetes Motivated to manage diabetes   Self-care barriers None   Self-management support Doctor's office   Other persons present Patient   Patient Concerns Nutrition/Meal planning   Special Needs None   Preferred Learning Style No preference indicated   Learning Readiness Ready   How often do you need to have someone help you when you read instructions, pamphlets, or other written materials from your doctor or pharmacy? 1 - Never   What is the last grade level you completed in school? 4 years college   Complications   Last HgB A1C per patient/outside source 5.7 %  11/06/14   How often do you check your blood sugar? Not recommended by provider   Have you had a dilated eye exam in the past 12  months? Yes   Have you had a dental exam in the past 12 months? Yes   Are you checking your feet? Yes   How many days per week are you checking your feet? 7   Dietary Intake   Breakfast fruit, apple/celery juice   Snack (morning) peanut butter NABS, celery, fruit, yogurt   Lunch leftovers    Snack (afternoon) fruit or nuts   Dinner Meat, vegetables, rice, occasional bread   Snack (evening) none  No food after 8:30   Beverage(s) water, apple/celery juice, OJ, cranberry juice, herbal tea   Exercise   Exercise Type Moderate (swimming / aerobic walking)  classes at [facility] (toning, zumba, cardio, aerobic)   How many days per week to you exercise? 5   How many minutes per day do you exercise? 60   Total minutes per week of exercise 300   Patient Education   Previous Diabetes Education No   Disease state  Definition of diabetes, type 1 and 2, and the diagnosis of diabetes;Explored patient's options for treatment of their diabetes   Nutrition management  Food label reading, portion sizes and measuring food.;Role of diet in the treatment of diabetes and the relationship between the three main macronutrients and blood glucose level;Meal options for control of blood glucose level and chronic complications.   Physical activity  and exercise  Role of exercise on diabetes management, blood pressure control and cardiac health.   Monitoring Identified appropriate SMBG and/or A1C goals.   Chronic complications Relationship between chronic complications and blood glucose control;Dental care;Retinopathy and reason for yearly dilated eye exams   Psychosocial adjustment Worked with patient to identify barriers to care and solutions   Individualized Goals (developed by patient)   Nutrition General guidelines for healthy choices and portions discussed   Physical Activity Exercise 5-7 days per week;60 minutes per day   Monitoring  Not Applicable   Outcomes   Expected Outcomes Demonstrated interest in learning.  Expect positive outcomes   Future DMSE 4-6 wks   Program Status Completed      Individualized Plan for Diabetes Self-Management Training:   Learning Objective:  Patient will have a greater understanding of diabetes self-management. Patient education plan is to attend individual and/or group sessions per assessed needs and concerns.   Plan:   Patient Instructions  Continue your exercise classes.  This is a great habit! Begin to be mindful about your eating choices, portion sizes and appetite. Breakfast, Lunch, Dinner daily and small snacks if you are hungry. When you are eating avoid distractions.  Ask am I hungry or am I eating for another reason. Aim for 3 carbohydrate choices with each meal  (45 grams carbohydrate).   Choose a small portion of protein with each meal. Consider adding an egg or nuts to breakfast with steel cut oats or multigrain unsweetened cereal and fruit.    Expected Outcomes:  Demonstrated interest in learning. Expect positive outcomes  Education material provided: Living Well with Diabetes, Meal plan card, My Plate and Snack sheet  If problems or questions, patient to contact team via:  Phone and Email  Future DSME appointment: 4-6 wks

## 2015-01-23 ENCOUNTER — Other Ambulatory Visit: Payer: Self-pay | Admitting: Internal Medicine

## 2015-02-06 ENCOUNTER — Ambulatory Visit: Payer: Medicaid Other | Admitting: Internal Medicine

## 2015-02-14 ENCOUNTER — Other Ambulatory Visit: Payer: Self-pay | Admitting: Internal Medicine

## 2015-04-13 ENCOUNTER — Other Ambulatory Visit: Payer: Self-pay | Admitting: Family Medicine

## 2015-04-20 ENCOUNTER — Other Ambulatory Visit: Payer: Self-pay | Admitting: Internal Medicine

## 2015-04-26 ENCOUNTER — Ambulatory Visit: Payer: Medicaid Other | Admitting: Family Medicine

## 2015-05-15 ENCOUNTER — Other Ambulatory Visit (INDEPENDENT_AMBULATORY_CARE_PROVIDER_SITE_OTHER): Payer: Medicaid Other

## 2015-05-15 ENCOUNTER — Other Ambulatory Visit: Payer: Self-pay | Admitting: Family Medicine

## 2015-05-15 DIAGNOSIS — R7303 Prediabetes: Secondary | ICD-10-CM | POA: Diagnosis not present

## 2015-05-16 LAB — HEMOGLOBIN A1C
Hgb A1c MFr Bld: 5.6 % (ref <5.7)
Mean Plasma Glucose: 114 mg/dL

## 2015-05-21 ENCOUNTER — Encounter (HOSPITAL_COMMUNITY): Payer: Self-pay | Admitting: Nurse Practitioner

## 2015-05-21 ENCOUNTER — Emergency Department (HOSPITAL_COMMUNITY)
Admission: EM | Admit: 2015-05-21 | Discharge: 2015-05-21 | Disposition: A | Discharge status: Home / Self Care | Payer: Medicaid Other | Attending: Emergency Medicine | Admitting: Emergency Medicine

## 2015-05-21 DIAGNOSIS — B20 Human immunodeficiency virus [HIV] disease: Secondary | ICD-10-CM | POA: Diagnosis not present

## 2015-05-21 DIAGNOSIS — Z86711 Personal history of pulmonary embolism: Secondary | ICD-10-CM | POA: Insufficient documentation

## 2015-05-21 DIAGNOSIS — Z8669 Personal history of other diseases of the nervous system and sense organs: Secondary | ICD-10-CM | POA: Diagnosis not present

## 2015-05-21 DIAGNOSIS — J45909 Unspecified asthma, uncomplicated: Secondary | ICD-10-CM | POA: Insufficient documentation

## 2015-05-21 DIAGNOSIS — M545 Low back pain: Secondary | ICD-10-CM | POA: Diagnosis present

## 2015-05-21 DIAGNOSIS — M5441 Lumbago with sciatica, right side: Secondary | ICD-10-CM | POA: Diagnosis not present

## 2015-05-21 DIAGNOSIS — Z8619 Personal history of other infectious and parasitic diseases: Secondary | ICD-10-CM | POA: Insufficient documentation

## 2015-05-21 DIAGNOSIS — Z79899 Other long term (current) drug therapy: Secondary | ICD-10-CM | POA: Diagnosis not present

## 2015-05-21 DIAGNOSIS — Z7951 Long term (current) use of inhaled steroids: Secondary | ICD-10-CM | POA: Diagnosis not present

## 2015-05-21 MED ORDER — CYCLOBENZAPRINE HCL 10 MG PO TABS: 10.0000 mg | ORAL_TABLET | Freq: Two times a day (BID) | ORAL | Status: DC | PRN

## 2015-05-21 MED ORDER — PREDNISONE 20 MG PO TABS: ORAL_TABLET | ORAL | Status: DC

## 2015-05-21 MED ORDER — HYDROCODONE-ACETAMINOPHEN 5-325 MG PO TABS: 1.0000 | ORAL_TABLET | Freq: Four times a day (QID) | ORAL | Status: DC | PRN

## 2015-05-21 NOTE — Discharge Instructions (Signed)
Radicular Pain °Radicular pain in either the arm or leg is usually from a bulging or herniated disk in the spine. A piece of the herniated disk may press against the nerves as the nerves exit the spine. This causes pain which is felt at the tips of the nerves down the arm or leg. Other causes of radicular pain may include: °· Fractures. °· Heart disease. °· Cancer. °· An abnormal and usually degenerative state of the nervous system or nerves (neuropathy). °Diagnosis may require CT or MRI scanning to determine the primary cause.  °Nerves that start at the neck (nerve roots) may cause radicular pain in the outer shoulder and arm. It can spread down to the thumb and fingers. The symptoms vary depending on which nerve root has been affected. In most cases radicular pain improves with conservative treatment. Neck problems may require physical therapy, a neck collar, or cervical traction. Treatment may take many weeks, and surgery may be considered if the symptoms do not improve.  °Conservative treatment is also recommended for sciatica. Sciatica causes pain to radiate from the lower back or buttock area down the leg into the foot. Often there is a history of back problems. Most patients with sciatica are better after 2 to 4 weeks of rest and other supportive care. Short term bed rest can reduce the disk pressure considerably. Sitting, however, is not a good position since this increases the pressure on the disk. You should avoid bending, lifting, and all other activities which make the problem worse. Traction can be used in severe cases. Surgery is usually reserved for patients who do not improve within the first months of treatment. °Only take over-the-counter or prescription medicines for pain, discomfort, or fever as directed by your caregiver. Narcotics and muscle relaxants may help by relieving more severe pain and spasm and by providing mild sedation. Cold or massage can give significant relief. Spinal manipulation  is not recommended. It can increase the degree of disc protrusion. Epidural steroid injections are often effective treatment for radicular pain. These injections deliver medicine to the spinal nerve in the space between the protective covering of the spinal cord and back bones (vertebrae). Your caregiver can give you more information about steroid injections. These injections are most effective when given within two weeks of the onset of pain.  °You should see your caregiver for follow up care as recommended. A program for neck and back injury rehabilitation with stretching and strengthening exercises is an important part of management.  °SEEK IMMEDIATE MEDICAL CARE IF: °· You develop increased pain, weakness, or numbness in your arm or leg. °· You develop difficulty with bladder or bowel control. °· You develop abdominal pain. °  °This information is not intended to replace advice given to you by your health care provider. Make sure you discuss any questions you have with your health care provider. °  °Document Released: 03/13/2004 Document Revised: 02/24/2014 Document Reviewed: 08/30/2014 °Elsevier Interactive Patient Education ©2016 Elsevier Inc. ° °

## 2015-05-21 NOTE — ED Notes (Signed)
she c/o 2 day history lower back pain. No noted activity at onset. Pain increased with movement. She has not tried anything for the pain at home. She denies any bowel/bladder changes. She is ambulatory, mae
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
## 2015-05-22 ENCOUNTER — Telehealth: Payer: Self-pay | Admitting: *Deleted

## 2015-05-22 NOTE — Telephone Encounter (Signed)
Patient left message asking for a TB test for her new job. Per chart review, patient has had a positive skin test in the past.  RN returned the call, left message instructing patient to call the [facility] Department for testing/clearance. [phone]  Landis Gandy, RN

## 2015-06-19 ENCOUNTER — Ambulatory Visit (INDEPENDENT_AMBULATORY_CARE_PROVIDER_SITE_OTHER): Payer: Medicaid Other | Admitting: Internal Medicine

## 2015-06-19 ENCOUNTER — Encounter: Payer: Self-pay | Admitting: Internal Medicine

## 2015-06-19 ENCOUNTER — Telehealth: Payer: Self-pay

## 2015-06-19 VITALS — BP 130/84 | HR 86 | Temp 97.7°F | Wt 222.0 lb

## 2015-06-19 DIAGNOSIS — M62830 Muscle spasm of back: Secondary | ICD-10-CM

## 2015-06-19 DIAGNOSIS — M170 Bilateral primary osteoarthritis of knee: Secondary | ICD-10-CM

## 2015-06-19 DIAGNOSIS — B20 Human immunodeficiency virus [HIV] disease: Secondary | ICD-10-CM | POA: Diagnosis present

## 2015-06-19 NOTE — Assessment & Plan Note (Signed)
Doing great.  Labs today and rtc 6 months unless concerns.  

## 2015-06-19 NOTE — Telephone Encounter (Signed)
Called patient to give name for an ophthalmologist that takes Medicaid. [facility] . [place], [place] [address] [phone]. Patient could not take information at the moment. Stated she will call back if she could not remember the information. Rodman Key, LPN

## 2015-06-19 NOTE — Assessment & Plan Note (Addendum)
Chronic.  I discussed weight loss and physical therapy.  Will need referral from PCP with medicaid No concerning signs.

## 2015-06-19 NOTE — Assessment & Plan Note (Signed)
Knee pain probably from this.  No swelling, occurs with activity.  Will need PT from PCP.  I discussed weight loss.

## 2015-06-19 NOTE — Progress Notes (Signed)
   Subjective:    Patient ID: Paula Williams, female    DOB: August 12, 1970, 45 y.o.   MRN: [id number]  HPI Here for follow up of HIV.  Continues on Genvoya with no issues.  Changed last June from [place].  No issues.  No missed doses. Did have labs last September and nearly suppressed with CD4 of 550.  Complaint of bilateral knee pain and low back pain. Has been chronic in nature.  No related trauma.  No bowel or bladder dysfunction.  Has had some weight gain.  Also notes some 'floaters' in her eyes for the last 4-5 months.  No eye pain, no injection.  No blurriness.     Review of Systems  Constitutional: Negative for fatigue.  Eyes: Positive for visual disturbance.  Gastrointestinal: Negative for diarrhea.  Musculoskeletal: Positive for back pain and arthralgias.  Neurological: Negative for dizziness.       Objective:   Physical Exam  Constitutional: She appears well-developed and well-nourished. No distress.  HENT:  Mouth/Throat: No oropharyngeal exudate.  Eyes: Right eye exhibits no discharge. Left eye exhibits discharge. No scleral icterus.  Left with some redness  Cardiovascular: Normal rate, regular rhythm and normal heart sounds.   No murmur heard. Pulmonary/Chest: Effort normal and breath sounds normal. No respiratory distress.  Lymphadenopathy:    She has no cervical adenopathy.  Skin: No rash noted.    Social History   Social History  . Marital Status: Single    Spouse Name: N/A  . Number of Children: 1  . Years of Education: N/A   Occupational History  . Not on file.   Social History Main Topics  . Smoking status: Never Smoker   . Smokeless tobacco: Never Used  . Alcohol Use: No  . Drug Use: No  . Sexual Activity:    Partners: Male    Birth Control/ Protection: Condom     Comment: declined condoms   Other Topics Concern  . Not on file   Social History Narrative        Assessment & Plan:

## 2015-06-20 LAB — HIV-1 RNA QUANT-NO REFLEX-BLD: HIV-1 RNA Quant, Log: <1.3 Log copies/mL (ref <1.30)

## 2015-06-20 LAB — T-HELPER CELL (CD4) - (RCID CLINIC ONLY)
CD4 T CELL HELPER: 28 % — AB (ref 33–55)
CD4 T Cell Abs: 600 /uL (ref 400–2700)

## 2015-07-02 ENCOUNTER — Other Ambulatory Visit: Payer: Self-pay

## 2015-07-02 ENCOUNTER — Ambulatory Visit (HOSPITAL_COMMUNITY)
Admission: RE | Admit: 2015-07-02 | Discharge: 2015-07-02 | Disposition: A | Discharge status: Home / Self Care | Payer: Medicaid Other | Source: Ambulatory Visit | Attending: Family Medicine | Admitting: Family Medicine

## 2015-07-02 DIAGNOSIS — R7611 Nonspecific reaction to tuberculin skin test without active tuberculosis: Secondary | ICD-10-CM | POA: Insufficient documentation

## 2015-07-05 ENCOUNTER — Encounter: Payer: Medicaid Other | Admitting: Family Medicine

## 2015-07-06 NOTE — Progress Notes (Signed)
This encounter was created in error - please disregard.

## 2015-08-03 ENCOUNTER — Other Ambulatory Visit: Payer: Self-pay | Admitting: Internal Medicine

## 2015-08-08 ENCOUNTER — Other Ambulatory Visit: Payer: Self-pay | Admitting: Family Medicine

## 2015-08-14 ENCOUNTER — Encounter: Payer: Self-pay | Admitting: Family Medicine

## 2015-08-14 ENCOUNTER — Ambulatory Visit (INDEPENDENT_AMBULATORY_CARE_PROVIDER_SITE_OTHER): Payer: Medicaid Other | Admitting: Family Medicine

## 2015-08-14 VITALS — BP 154/78 | HR 75 | Temp 98.1°F | Resp 16 | Ht 66.0 in | Wt 226.0 lb

## 2015-08-14 DIAGNOSIS — R300 Dysuria: Secondary | ICD-10-CM

## 2015-08-14 DIAGNOSIS — M549 Dorsalgia, unspecified: Secondary | ICD-10-CM

## 2015-08-14 DIAGNOSIS — R350 Frequency of micturition: Secondary | ICD-10-CM

## 2015-08-14 DIAGNOSIS — N390 Urinary tract infection, site not specified: Secondary | ICD-10-CM | POA: Diagnosis not present

## 2015-08-14 DIAGNOSIS — G8929 Other chronic pain: Secondary | ICD-10-CM

## 2015-08-14 LAB — POCT URINALYSIS DIP (DEVICE)
Bilirubin Urine: NEGATIVE
GLUCOSE, UA: NEGATIVE mg/dL
KETONES UR: NEGATIVE mg/dL
Nitrite: NEGATIVE
PROTEIN: 30 mg/dL — AB
Specific Gravity, Urine: 1.02 (ref 1.005–1.030)
UROBILINOGEN UA: 0.2 mg/dL (ref 0.0–1.0)
pH: 5.5 (ref 5.0–8.0)

## 2015-08-14 MED ORDER — CYCLOBENZAPRINE HCL 10 MG PO TABS: 10.0000 mg | ORAL_TABLET | Freq: Two times a day (BID) | ORAL | Status: DC | PRN

## 2015-08-14 MED ORDER — ACETAMINOPHEN-CODEINE #3 300-30 MG PO TABS: 1.0000 | ORAL_TABLET | Freq: Four times a day (QID) | ORAL | Status: DC | PRN

## 2015-08-14 MED ORDER — CIPROFLOXACIN HCL 250 MG PO TABS: 250.0000 mg | ORAL_TABLET | Freq: Two times a day (BID) | ORAL | Status: DC
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
## 2015-08-14 NOTE — Patient Instructions (Signed)
Urinary Tract Infection Urinary tract infections (UTIs) can develop anywhere along your urinary tract. Your urinary tract is your body's drainage system for removing wastes and extra water. Your urinary tract includes two kidneys, two ureters, a bladder, and a urethra. Your kidneys are a pair of bean-shaped organs. Each kidney is about the size of your fist. They are located below your ribs, one on each side of your spine. CAUSES Infections are caused by microbes, which are microscopic organisms, including fungi, viruses, and bacteria. These organisms are so small that they can only be seen through a microscope. Bacteria are the microbes that most commonly cause UTIs. SYMPTOMS  Symptoms of UTIs may vary by age and gender of the patient and by the location of the infection. Symptoms in young women typically include a frequent and intense urge to urinate and a painful, burning feeling in the bladder or urethra during urination. Older women and men are more likely to be tired, shaky, and weak and have muscle aches and abdominal pain. A fever may mean the infection is in your kidneys. Other symptoms of a kidney infection include pain in your back or sides below the ribs, nausea, and vomiting. DIAGNOSIS To diagnose a UTI, your caregiver will ask you about your symptoms. Your caregiver will also ask you to provide a urine sample. The urine sample will be tested for bacteria and white blood cells. White blood cells are made by your body to help fight infection. TREATMENT  Typically, UTIs can be treated with medication. Because most UTIs are caused by a bacterial infection, they usually can be treated with the use of antibiotics. The choice of antibiotic and length of treatment depend on your symptoms and the type of bacteria causing your infection. HOME CARE INSTRUCTIONS  If you were prescribed antibiotics, take them exactly as your caregiver instructs you. Finish the medication even if you feel better after  you have only taken some of the medication.  Drink enough water and fluids to keep your urine clear or pale yellow.  Avoid caffeine, tea, and carbonated beverages. They tend to irritate your bladder.  Empty your bladder often. Avoid holding urine for long periods of time.  Empty your bladder before and after sexual intercourse.  After a bowel movement, women should cleanse from front to back. Use each tissue only once. SEEK MEDICAL CARE IF:   You have back pain.  You develop a fever.  Your symptoms do not begin to resolve within 3 days. SEEK IMMEDIATE MEDICAL CARE IF:   You have severe back pain or lower abdominal pain.  You develop chills.  You have nausea or vomiting.  You have continued burning or discomfort with urination. MAKE SURE YOU:   Understand these instructions.  Will watch your condition.  Will get help right away if you are not doing well or get worse.   This information is not intended to replace advice given to you by your health care provider. Make sure you discuss any questions you have with your health care provider.   Document Released: 11/13/2004 Document Revised: 10/25/2014 Document Reviewed: 03/14/2011 Elsevier Interactive Patient Education 2016 Elsevier Inc. Back Pain, Adult Back pain is very common in adults.The cause of back pain is rarely dangerous and the pain often gets better over time.The cause of your back pain may not be known. Some common causes of back pain include:  Strain of the muscles or ligaments supporting the spine.  Wear and tear (degeneration) of the spinal disks.  Arthritis.  Direct injury to the back. For many people, back pain may return. Since back pain is rarely dangerous, most people can learn to manage this condition on their own. HOME CARE INSTRUCTIONS Watch your back pain for any changes. The following actions may help to lessen any discomfort you are feeling:  Remain active. It is stressful on your back to  sit or stand in one place for long periods of time. Do not sit, drive, or stand in one place for more than 30 minutes at a time. Take short walks on even surfaces as soon as you are able.Try to increase the length of time you walk each day.  Exercise regularly as directed by your health care provider. Exercise helps your back heal faster. It also helps avoid future injury by keeping your muscles strong and flexible.  Do not stay in bed.Resting more than 1-2 days can delay your recovery.  Pay attention to your body when you bend and lift. The most comfortable positions are those that put less stress on your recovering back. Always use proper lifting techniques, including:  Bending your knees.  Keeping the load close to your body.  Avoiding twisting.  Find a comfortable position to sleep. Use a firm mattress and lie on your side with your knees slightly bent. If you lie on your back, put a pillow under your knees.  Avoid feeling anxious or stressed.Stress increases muscle tension and can worsen back pain.It is important to recognize when you are anxious or stressed and learn ways to manage it, such as with exercise.  Take medicines only as directed by your health care provider. Over-the-counter medicines to reduce pain and inflammation are often the most helpful.Your health care provider may prescribe muscle relaxant drugs.These medicines help dull your pain so you can more quickly return to your normal activities and healthy exercise.  Apply ice to the injured area:  Put ice in a plastic bag.  Place a towel between your skin and the bag.  Leave the ice on for 20 minutes, 2-3 times a day for the first 2-3 days. After that, ice and heat may be alternated to reduce pain and spasms.  Maintain a healthy weight. Excess weight puts extra stress on your back and makes it difficult to maintain good posture. SEEK MEDICAL CARE IF:  You have pain that is not relieved with rest or  medicine.  You have increasing pain going down into the legs or buttocks.  You have pain that does not improve in one week.  You have night pain.  You lose weight.  You have a fever or chills. SEEK IMMEDIATE MEDICAL CARE IF:   You develop new bowel or bladder control problems.  You have unusual weakness or numbness in your arms or legs.  You develop nausea or vomiting.  You develop abdominal pain.  You feel faint.   This information is not intended to replace advice given to you by your health care provider. Make sure you discuss any questions you have with your health care provider.   Document Released: 02/03/2005 Document Revised: 02/24/2014 Document Reviewed: 06/07/2013 Elsevier Interactive Patient Education [employer].

## 2015-08-16 LAB — URINE CULTURE

## 2015-08-17 ENCOUNTER — Telehealth: Payer: Self-pay

## 2015-08-17 NOTE — Telephone Encounter (Signed)
-----   Message from Dorena Dew, [place] sent at 08/16/2015  3:28 PM EDT ----- Regarding: lab results Please inform [name] that she has E.coli growing in her urine. Please complete antibiotic as prescribed.   Thanks  ----- Message -----    From: Lab in [facility]    Sent: 08/16/2015   3:00 PM      To: Dorena Dew, FNP

## 2015-08-17 NOTE — Telephone Encounter (Signed)
Called, left message for patient to return call. Thanks!

## 2015-08-17 NOTE — Telephone Encounter (Signed)
Patient called back and I advised of Bacteria in urine and to complete antibiotics as prescribed. Patient verbalized understanding. Thanks!

## 2015-10-17 ENCOUNTER — Ambulatory Visit (INDEPENDENT_AMBULATORY_CARE_PROVIDER_SITE_OTHER): Payer: Medicaid Other | Admitting: Family Medicine

## 2015-10-17 ENCOUNTER — Encounter: Payer: Self-pay | Admitting: Family Medicine

## 2015-10-17 VITALS — BP 145/68 | HR 73 | Temp 98.5°F | Resp 16 | Ht 66.0 in | Wt 229.0 lb

## 2015-10-17 DIAGNOSIS — M545 Low back pain: Secondary | ICD-10-CM

## 2015-10-17 MED ORDER — PREDNISONE 20 MG PO TABS: 20.0000 mg | ORAL_TABLET | Freq: Every day | ORAL | 0 refills | Status: AC

## 2015-10-17 MED ORDER — KETOROLAC TROMETHAMINE 30 MG/ML IJ SOLN
30.0000 mg | Freq: Once | INTRAMUSCULAR | Status: AC
  Administered 2015-10-17: 30 mg via INTRAMUSCULAR
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
## 2015-10-17 NOTE — Patient Instructions (Signed)
Will put in referral to orthopedist. Take prednisone as prescribed

## 2016-01-17 ENCOUNTER — Ambulatory Visit: Payer: Medicaid Other | Admitting: Family Medicine

## 2016-02-14 ENCOUNTER — Ambulatory Visit: Payer: Self-pay

## 2016-02-15 ENCOUNTER — Other Ambulatory Visit: Payer: Self-pay | Admitting: *Deleted

## 2016-02-15 MED ORDER — ELVITEG-COBIC-EMTRICIT-TENOFAF 150-150-200-10 MG PO TABS: ORAL_TABLET | ORAL | 4 refills | Status: DC

## 2016-03-10 ENCOUNTER — Ambulatory Visit: Payer: Medicaid Other | Admitting: Family Medicine

## 2016-04-02 ENCOUNTER — Ambulatory Visit: Payer: Self-pay

## 2016-05-28 ENCOUNTER — Ambulatory Visit (INDEPENDENT_AMBULATORY_CARE_PROVIDER_SITE_OTHER): Payer: Managed Care, Other (non HMO) | Admitting: Family Medicine

## 2016-05-28 ENCOUNTER — Other Ambulatory Visit: Payer: Self-pay | Admitting: Internal Medicine

## 2016-05-28 ENCOUNTER — Encounter: Payer: Self-pay | Admitting: Family Medicine

## 2016-05-28 ENCOUNTER — Other Ambulatory Visit: Payer: Managed Care, Other (non HMO)

## 2016-05-28 ENCOUNTER — Other Ambulatory Visit: Payer: Self-pay | Admitting: *Deleted

## 2016-05-28 VITALS — BP 152/90 | HR 80 | Temp 98.6°F | Resp 18 | Ht 66.0 in | Wt 227.0 lb

## 2016-05-28 DIAGNOSIS — Z113 Encounter for screening for infections with a predominantly sexual mode of transmission: Secondary | ICD-10-CM

## 2016-05-28 DIAGNOSIS — G8929 Other chronic pain: Secondary | ICD-10-CM

## 2016-05-28 DIAGNOSIS — M5442 Lumbago with sciatica, left side: Secondary | ICD-10-CM

## 2016-05-28 DIAGNOSIS — R030 Elevated blood-pressure reading, without diagnosis of hypertension: Secondary | ICD-10-CM

## 2016-05-28 DIAGNOSIS — Z21 Asymptomatic human immunodeficiency virus [HIV] infection status: Secondary | ICD-10-CM

## 2016-05-28 DIAGNOSIS — M5441 Lumbago with sciatica, right side: Secondary | ICD-10-CM | POA: Diagnosis not present

## 2016-05-28 DIAGNOSIS — Z0000 Encounter for general adult medical examination without abnormal findings: Secondary | ICD-10-CM

## 2016-05-28 DIAGNOSIS — Z131 Encounter for screening for diabetes mellitus: Secondary | ICD-10-CM | POA: Diagnosis not present

## 2016-05-28 DIAGNOSIS — B20 Human immunodeficiency virus [HIV] disease: Secondary | ICD-10-CM

## 2016-05-28 DIAGNOSIS — Z79899 Other long term (current) drug therapy: Secondary | ICD-10-CM

## 2016-05-28 LAB — CBC WITH DIFFERENTIAL/PLATELET
BASOS PCT: 0 %
Basophils Absolute: 0 cells/uL (ref 0–200)
EOS PCT: 4 %
Eosinophils Absolute: 188 cells/uL (ref 15–500)
HCT: 40.6 % (ref 35.0–45.0)
Hemoglobin: 13.4 g/dL (ref 11.7–15.5)
Lymphocytes Relative: 55 %
Lymphs Abs: 2585 cells/uL (ref 850–3900)
MCH: 29.1 pg (ref 27.0–33.0)
MCHC: 33 g/dL (ref 32.0–36.0)
MCV: 88.3 fL (ref 80.0–100.0)
MPV: 10.9 fL (ref 7.5–12.5)
Monocytes Absolute: 470 cells/uL (ref 200–950)
Monocytes Relative: 10 %
NEUTROS ABS: 1457 {cells}/uL — AB (ref 1500–7800)
Neutrophils Relative %: 31 %
Platelets: 202 10*3/uL (ref 140–400)
RBC: 4.6 MIL/uL (ref 3.80–5.10)
RDW: 14.4 % (ref 11.0–15.0)
WBC: 4.7 10*3/uL (ref 3.8–10.8)

## 2016-05-28 LAB — POCT URINALYSIS DIP (DEVICE)
BILIRUBIN URINE: NEGATIVE
GLUCOSE, UA: NEGATIVE mg/dL
HGB URINE DIPSTICK: NEGATIVE
Ketones, ur: NEGATIVE mg/dL
Leukocytes, UA: NEGATIVE
NITRITE: NEGATIVE
Protein, ur: NEGATIVE mg/dL
SPECIFIC GRAVITY, URINE: 1.02 (ref 1.005–1.030)
Urobilinogen, UA: 0.2 mg/dL (ref 0.0–1.0)
pH: 6 (ref 5.0–8.0)

## 2016-05-28 LAB — LIPID PANEL
CHOL/HDL RATIO: 4.9 ratio (ref <5.0)
CHOLESTEROL: 194 mg/dL (ref <200)
HDL: 40 mg/dL — ABNORMAL LOW (ref >50)
LDL Cholesterol: 123 mg/dL — ABNORMAL HIGH (ref <100)
Triglycerides: 153 mg/dL — ABNORMAL HIGH (ref <150)
VLDL: 31 mg/dL — ABNORMAL HIGH (ref <30)

## 2016-05-28 LAB — COMPREHENSIVE METABOLIC PANEL
ALBUMIN: 3.9 g/dL (ref 3.6–5.1)
ALK PHOS: 58 U/L (ref 33–115)
ALT: 16 U/L (ref 6–29)
AST: 18 U/L (ref 10–35)
BILIRUBIN TOTAL: 0.3 mg/dL (ref 0.2–1.2)
BUN: 15 mg/dL (ref 7–25)
CALCIUM: 9.4 mg/dL (ref 8.6–10.2)
CO2: 25 mmol/L (ref 20–31)
Chloride: 101 mmol/L (ref 98–110)
Creat: 1.27 mg/dL — ABNORMAL HIGH (ref 0.50–1.10)
Glucose, Bld: 97 mg/dL (ref 65–99)
Potassium: 4.3 mmol/L (ref 3.5–5.3)
Sodium: 136 mmol/L (ref 135–146)
Total Protein: 7.8 g/dL (ref 6.1–8.1)

## 2016-05-28 MED ORDER — SELENIUM SULFIDE 2.5 % EX LOTN: TOPICAL_LOTION | CUTANEOUS | 0 refills | Status: DC

## 2016-05-28 MED ORDER — METHOCARBAMOL 500 MG PO TABS: 500.0000 mg | ORAL_TABLET | Freq: Four times a day (QID) | ORAL | 1 refills | Status: DC

## 2016-05-28 NOTE — Patient Instructions (Addendum)
I am referring you to the [facility] [phone]  If you notice persistent readings of elevated blood pressure greater than 150/90 Please contact me sooner than your 1 month follow-up.   Hypertension Hypertension is another name for high blood pressure. High blood pressure forces your heart to work harder to pump blood. This can cause problems over time. There are two numbers in a blood pressure reading. There is a top number (systolic) over a bottom number (diastolic). It is best to have a blood pressure below 120/80. Healthy choices can help lower your blood pressure. You may need medicine to help lower your blood pressure if:  Your blood pressure cannot be lowered with healthy choices.  Your blood pressure is higher than 130/80. Follow these instructions at home: Eating and drinking   If directed, follow the DASH eating plan. This diet includes:  Filling half of your plate at each meal with fruits and vegetables.  Filling one quarter of your plate at each meal with whole grains. Whole grains include whole wheat pasta, brown rice, and whole grain bread.  Eating or drinking low-fat dairy products, such as skim milk or low-fat yogurt.  Filling one quarter of your plate at each meal with low-fat (lean) proteins. Low-fat proteins include fish, skinless chicken, eggs, beans, and tofu.  Avoiding fatty meat, cured and processed meat, or chicken with skin.  Avoiding premade or processed food.  Eat less than 1,500 mg of salt (sodium) a day.  Limit alcohol use to no more than 1 drink a day for nonpregnant women and 2 drinks a day for men. One drink equals 12 oz of beer, 5 oz of wine, or 1 oz of hard liquor. Lifestyle   Work with your doctor to stay at a healthy weight or to lose weight. Ask your doctor what the best weight is for you.  Get at least 30 minutes of exercise that causes your heart to beat faster (aerobic exercise) most days of the week. This may  include walking, swimming, or biking.  Get at least 30 minutes of exercise that strengthens your muscles (resistance exercise) at least 3 days a week. This may include lifting weights or pilates.  Do not use any products that contain nicotine or tobacco. This includes cigarettes and e-cigarettes. If you need help quitting, ask your doctor.  Check your blood pressure at home as told by your doctor.  Keep all follow-up visits as told by your doctor. This is important. Medicines   Take over-the-counter and prescription medicines only as told by your doctor. Follow directions carefully.  Do not skip doses of blood pressure medicine. The medicine does not work as well if you skip doses. Skipping doses also puts you at risk for problems.  Ask your doctor about side effects or reactions to medicines that you should watch for. Contact a doctor if:  You think you are having a reaction to the medicine you are taking.  You have headaches that keep coming back (recurring).  You feel dizzy.  You have swelling in your ankles.  You have trouble with your vision. Get help right away if:  You get a very bad headache.  You start to feel confused.  You feel weak or numb.  You feel faint.  You get very bad pain in your:  Chest.  Belly (abdomen).  You throw up (vomit) more than once.  You have trouble breathing. Summary  Hypertension is another name for high blood pressure.  Making healthy  choices can help lower blood pressure. If your blood pressure cannot be controlled with healthy choices, you may need to take medicine. This information is not intended to replace advice given to you by your health care provider. Make sure you discuss any questions you have with your health care provider. Document Released: 07/23/2007 Document Revised: 01/02/2016 Document Reviewed: 01/02/2016 Elsevier Interactive Patient Education  2017 Elsevier Inc.   Back Pain, Adult Back pain is very common.  The pain often gets better over time. The cause of back pain is usually not dangerous. Most people can learn to manage their back pain on their own. Follow these instructions at home: Watch your back pain for any changes. The following actions may help to lessen any pain you are feeling:  Stay active. Start with short walks on flat ground if you can. Try to walk farther each day.  Exercise regularly as told by your doctor. Exercise helps your back heal faster. It also helps avoid future injury by keeping your muscles strong and flexible.  Do not sit, drive, or stand in one place for more than 30 minutes.  Do not stay in bed. Resting more than 1-2 days can slow down your recovery.  Be careful when you bend or lift an object. Use good form when lifting:  Bend at your knees.  Keep the object close to your body.  Do not twist.  Sleep on a firm mattress. Lie on your side, and bend your knees. If you lie on your back, put a pillow under your knees.  Take medicines only as told by your doctor.  Put ice on the injured area.  Put ice in a plastic bag.  Place a towel between your skin and the bag.  Leave the ice on for 20 minutes, 2-3 times a day for the first 2-3 days. After that, you can switch between ice and heat packs.  Avoid feeling anxious or stressed. Find good ways to deal with stress, such as exercise.  Maintain a healthy weight. Extra weight puts stress on your back. Contact a doctor if:  You have pain that does not go away with rest or medicine.  You have worsening pain that goes down into your legs or buttocks.  You have pain that does not get better in one week.  You have pain at night.  You lose weight.  You have a fever or chills. Get help right away if:  You cannot control when you poop (bowel movement) or pee (urinate).  Your arms or legs feel weak.  Your arms or legs lose feeling (numbness).  You feel sick to your stomach (nauseous) or throw up  (vomit).  You have belly (abdominal) pain.  You feel like you may pass out (faint). This information is not intended to replace advice given to you by your health care provider. Make sure you discuss any questions you have with your health care provider. Document Released: 07/23/2007 Document Revised: 07/12/2015 Document Reviewed: 06/07/2013 Elsevier Interactive Patient Education  2017 [employer].

## 2016-05-28 NOTE — Progress Notes (Signed)
Patient ID: Paula Williams, female    DOB: 1970/06/01, 45 y.o.   MRN: [id number]  PCP: Dorena Dew, FNP  Chief Complaint  Patient presents with  . Annual Exam    NO PAP  . Medication Refill    GENVOYA  . Back Pain    Subjective:  HPI  Paula Williams is a 46 y.o. female presents for an annual exam and evaluation of on-going back pain. Medical problems include: HIV, Asthma, Anxiety, Chronic Back Pain, Latent TB  Nami reports delayed follow-up with her infectious disease specialist as she was under the impression that she didn't have medical insurance as her medicaid was cancelled. She recently found out that she is insured through her school's healthcare plan. Plans to follow-up with her infectious disease appointment in the near future.  Would like to be evaluated by a specialist for chronic ongoing back pain. She had an MRI completed 2016 which note chronic degenerative endplate disease  A3-F5 and right foraminal disc protrusion and left foraminal protrusion at L3-L4. She reports persistent back pain over 2 years. Denies history of injury. Has taken Toradol injections in the past with relief although her last injection caused a significant reaction involving a rash and swelling. She has tried flexeril with only mild relief.  Blood pressure she reports noticing that reading have periodically been high over the last month. She reports the highest reading 147/98. She has been checking blood pressure regular since having a dizziness episode earlier in the month. Denies headaches, chest pain, or shortness of breath.  Social History   Social History  . Marital status: Single    Spouse name: N/A  . Number of children: 1  . Years of education: N/A   Occupational History  . Not on file.   Social History Main Topics  . Smoking status: Never Smoker  . Smokeless tobacco: Never Used  . Alcohol use No  . Drug use: No  . Sexual activity: Yes    Partners: Male    Birth  control/ protection: Condom     Comment: declined condoms   Other Topics Concern  . Not on file   Social History Narrative  . No narrative on file    Family History  Problem Relation Age of Onset  . Diabetes Mother   . Hypertension Father      Review of Systems  Constitutional:       To good of an appetite. Trying to eat more healthy.  HENT: Negative.   Eyes: Positive for visual disturbance.       Blurred vision an noticed changes in vision.  Respiratory: Negative.   Cardiovascular: Negative.   Gastrointestinal: Negative for diarrhea, nausea, rectal pain and vomiting.       Acid reflux occasional -treats with drinking water.  Endocrine: Negative.   Genitourinary: Negative.   Musculoskeletal: Positive for back pain.  Neurological: Positive for dizziness.  Hematological: Negative.   Psychiatric/Behavioral: Negative for sleep disturbance. The patient is nervous/anxious.        Manage anxiety with baths, talk, and walks. Mostly related to school and being uninsured     Patient Active Problem List   Diagnosis Date Noted  . Osteoarthritis of both knees 06/19/2015  . Right hip pain 01/02/2015  . History of dizziness 11/13/2014  . Prediabetes 11/13/2014  . Screening examination for venereal disease 07/25/2014  . Chronic back pain greater than 3 months duration 02/23/2014  . Decreased range of motion of intervertebral discs of lumbar spine 12/14/2013  .  Back spasm 12/14/2013  . SOB (shortness of breath) 10/31/2013  . HIV disease ([place]) 08/19/2012  . Anxiety state 08/19/2012  . History of BCG vaccination 08/11/2012  . Fibroids 08/03/2012  . 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 Asthma, chronic 08/03/2012  . G6PD deficiency ([place]) 08/03/2012  . 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 Intramural leiomyoma of uterus 08/03/2012  . Molluscum contagiosum 08/03/2012    Allergies  Allergen Reactions  . Tetracyclines & Related Swelling    Facial swelling  . Ampicillin Rash  .  Clindamycin/Lincomycin Rash  . Sustiva [Efavirenz] Other (See Comments)    Intolerable nightmares  . Toradol [Ketorolac Tromethamine]     Allergic reaction   . Tramadol Swelling    Prior to Admission medications   Medication Sig Start Date End Date Taking? Authorizing Provider  acetaminophen-codeine (TYLENOL #3) 300-30 MG tablet Take 1 tablet by mouth every 6 (six) hours as needed for moderate pain. Patient not taking: Reported on 05/28/2016 08/14/15   Dorena Dew, FNP  albuterol (PROVENTIL HFA;VENTOLIN HFA) 108 (90 BASE) MCG/ACT inhaler Inhale 2 puffs into the lungs every 6 (six) hours as needed for wheezing or shortness of breath. Patient not taking: Reported on 05/28/2016 08/23/14   Micheline Chapman, NP  Ascorbic Acid (VITAMIN C PO) Take 1 tablet by mouth daily. Reported on 06/19/2015    Historical Provider, MD  beclomethasone (QVAR) 80 MCG/ACT inhaler Inhale 1 puff into the lungs 2 (two) times daily. Patient not taking: Reported on 05/28/2016 11/21/13   Dorena Dew, FNP  Cholecalciferol (VITAMIN D) 1000 UNITS capsule Take 1 capsule (1,000 Units total) by mouth daily. Patient not taking: Reported on 05/28/2016 08/10/12   Thayer Headings, MD  cyclobenzaprine (FLEXERIL) 10 MG tablet Take 1 tablet (10 mg total) by mouth 2 (two) times daily as needed for muscle spasms. Patient not taking: Reported on 05/28/2016 08/14/15   Dorena Dew, FNP  diphenhydrAMINE (BENADRYL) 25 MG tablet Take 1 tablet (25 mg total) by mouth every 4 (four) hours as needed for itching or allergies. x3 days or until your symptoms resolve Patient not taking: Reported on 05/28/2016 01/02/15   [place], PA-C  elvitegravir-cobicistat-emtricitabine-tenofovir (GENVOYA) 150-150-200-10 MG TABS tablet TAKE 1 TABLET BY MOUTH DAILY. NEED APPOINTMENT. Patient not taking: Reported on 05/28/2016 02/15/16   Campbell Riches, MD  loratadine (CLARITIN) 10 MG tablet Take 10 mg by mouth daily as needed for allergies. Reported on  06/19/2015    Historical Provider, MD  selenium sulfide (SELSUN) 2.5 % shampoo Apply 1 application topically daily as needed for irritation. Patient not taking: Reported on 05/28/2016 11/06/14   Dorena Dew, FNP  selenium sulfide (SELSUN) 2.5 % shampoo APPLY EXTERNALLY TO THE AFFECTED AREA DAILY AS NEEDED FOR IRRITATION Patient not taking: Reported on 05/28/2016 08/08/15   Dorena Dew, FNP  VITAMIN A PO Take 1 tablet by mouth daily.    Historical Provider, MD    Past Medical, Surgical Family and Social History reviewed and updated.    Objective:   Today's Vitals   05/28/16 1045  BP: 140/80  Pulse: 80  Resp: 18  Temp: 98.6 F (37 C)  TempSrc: Oral  SpO2: 97%  Weight: 227 lb (103 kg)  Height: 5\' 6"  (1.676 m)    Wt Readings from Last 3 Encounters:  05/28/16 227 lb (103 kg)  10/17/15 229 lb (103.9 kg)  08/14/15 226 lb (102.5 kg)    Physical Exam  Constitutional: She is oriented to person, place,  and time. She appears well-developed and well-nourished.  HENT:  Head: Normocephalic and atraumatic.  Right Ear: External ear normal.  Left Ear: External ear normal.  Nose: Nose normal.  Mouth/Throat: Oropharynx is clear and moist.  Eyes: Conjunctivae are normal. Pupils are equal, round, and reactive to light.  Neck: Normal range of motion.  Cardiovascular: Normal rate, regular rhythm, normal heart sounds and intact distal pulses.   Pulmonary/Chest: Effort normal and breath sounds normal.  Musculoskeletal: Normal range of motion.  Neurological: She is alert and oriented to person, place, and time.  Skin: Skin is warm and dry.  Psychiatric: She has a normal mood and affect. Her behavior is normal. Thought content normal.     Assessment & Plan:  1. Annual physical exam -Age anticipatory guidance provided  2. Screening for diabetes mellitus - COMPLETE METABOLIC PANEL WITH GFR - Hemoglobin A1c - Lipid panel  3. Elevated blood pressure reading - CBC with Differential -  Thyroid Panel With TSH  4. Chronic bilateral low back pain with bilateral sciatica - Ambulatory referral to Spine Surgery  Follow- 1 month PAP and hypertension reheck  I am referring you to the Spine and Scoliosis Specialist -[phone]  If you notice persistent readings of elevated blood pressure greater than 150/90   Please contact me sooner than your 1 month follow-up.   Carroll Sage. Kenton Kingfisher, MSN, [facility] [address] [place], [place] [address] [phone]

## 2016-05-28 NOTE — Telephone Encounter (Signed)
RN spoke with the patient.  She has not taken Genvoya for the past 4 months.  She actually has refills on file with her pharmacy.  She came in today for lab work and has a MD appt in 2 weeks.  Patient was advised to hold off on the [facility] until her visit with the  MD and lab work is reviewed.

## 2016-05-29 LAB — T-HELPER CELL (CD4) - (RCID CLINIC ONLY)
CD4 % Helper T Cell: 22 % — ABNORMAL LOW (ref 33–55)
CD4 T Cell Abs: 570 /uL (ref 400–2700)

## 2016-05-29 LAB — RPR

## 2016-05-30 LAB — HIV-1 RNA,QN PCR W/REFLEX GENOTYPE
HIV-1 RNA, QN PCR: 1.76 Log cps/mL — ABNORMAL HIGH
HIV-1 RNA, QN PCR: 58 Copies/mL — ABNORMAL HIGH

## 2016-06-12 ENCOUNTER — Encounter: Payer: Self-pay | Admitting: Internal Medicine

## 2016-06-12 ENCOUNTER — Other Ambulatory Visit: Payer: Self-pay | Admitting: *Deleted

## 2016-06-12 ENCOUNTER — Ambulatory Visit (INDEPENDENT_AMBULATORY_CARE_PROVIDER_SITE_OTHER): Payer: Managed Care, Other (non HMO) | Admitting: Internal Medicine

## 2016-06-12 VITALS — BP 161/104 | HR 91 | Temp 98.3°F | Ht 66.0 in | Wt 224.0 lb

## 2016-06-12 DIAGNOSIS — B20 Human immunodeficiency virus [HIV] disease: Secondary | ICD-10-CM | POA: Diagnosis not present

## 2016-06-12 DIAGNOSIS — R21 Rash and other nonspecific skin eruption: Secondary | ICD-10-CM

## 2016-06-12 MED ORDER — ELVITEG-COBIC-EMTRICIT-TENOFAF 150-150-200-10 MG PO TABS: ORAL_TABLET | ORAL | 11 refills | Status: DC

## 2016-06-12 MED ORDER — DIPHENHYDRAMINE HCL 25 MG PO CAPS: 25.0000 mg | ORAL_CAPSULE | Freq: Four times a day (QID) | ORAL | 2 refills | Status: DC | PRN

## 2016-06-12 NOTE — Assessment & Plan Note (Signed)
New problem.  Will try Benadryl. 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

## 2016-06-12 NOTE — Progress Notes (Signed)
   Subjective:    Patient ID: Paula Williams, female    DOB: Jul 14, 1970, 46 y.o.   MRN: [id number]  HPI Here for follow up of HIV.   Has been on Genvoya but unfortunately stopped about 4 months ago after getting insurance and switching off of ADAP.  She really is unable to explain why she stopped but waited until she came in to see me to restart.  Has a copay card already.  Viral load was just 58 copies, CD4 of 570.  Has new pruritic rash.  Also complaint of back pain, chronic, darkening on her lower lip.    Review of Systems  Constitutional: Negative for fatigue.  Gastrointestinal: Negative for diarrhea.  Skin: Positive for rash.  Neurological: Negative for dizziness.       Objective:   Physical Exam  Constitutional: She appears well-developed and well-nourished. No distress.  Eyes: No scleral icterus.  Cardiovascular: Normal rate, regular rhythm and normal heart sounds.   Skin: No rash noted.   SH: no alcohol       Assessment & Plan:

## 2016-06-12 NOTE — Assessment & Plan Note (Addendum)
Restart Genvoya.  Labs in 4 weeks and follow up with PharmD. Follow up then with me 3 nmonths later with CD4 and viral load before appt.

## 2016-06-17 ENCOUNTER — Telehealth: Payer: Self-pay | Admitting: Family Medicine

## 2016-06-17 NOTE — Telephone Encounter (Signed)
These labs never showed as resulted but they have updated under the patient's chart.  All labs were consistent with patient's baseline with the exception of  Creatinine, which is reflective of your renal function is mildly elevated. Increase water intake to 6-8 glasses over water daily to adequately hydrate your kidneys.  Cholesterol panel was mildly abnormal. This can be improved with increases in physical activity and reducing intake of food high in fat and or processed foods.  For now we will continue to monitor levels during routine follow-up. No medication indicated presently.

## 2016-06-18 NOTE — Telephone Encounter (Signed)
Left a vm for patient to callback 

## 2016-06-18 NOTE — Telephone Encounter (Signed)
Mickel Baas spoke with patient regarding results and a referral was sent over to spine and scoliosis on 06/17/2016.

## 2016-06-19 ENCOUNTER — Ambulatory Visit (INDEPENDENT_AMBULATORY_CARE_PROVIDER_SITE_OTHER): Payer: Managed Care, Other (non HMO) | Admitting: Family Medicine

## 2016-06-19 ENCOUNTER — Encounter: Payer: Self-pay | Admitting: Family Medicine

## 2016-06-19 VITALS — BP 134/86 | HR 89 | Temp 98.6°F | Resp 16 | Ht 66.0 in | Wt 223.0 lb

## 2016-06-19 DIAGNOSIS — R21 Rash and other nonspecific skin eruption: Secondary | ICD-10-CM | POA: Diagnosis not present

## 2016-06-19 DIAGNOSIS — I10 Essential (primary) hypertension: Secondary | ICD-10-CM

## 2016-06-19 MED ORDER — CETIRIZINE HCL 10 MG PO TABS: 10.0000 mg | ORAL_TABLET | Freq: Every day | ORAL | 11 refills | Status: DC

## 2016-06-19 MED ORDER — TRIAMCINOLONE ACETONIDE 0.1 % EX CREA: 1.0000 "application " | TOPICAL_CREAM | Freq: Two times a day (BID) | CUTANEOUS | 0 refills | Status: DC

## 2016-06-19 NOTE — Progress Notes (Signed)
Patient ID: Paula Williams, female    DOB: 1970/05/24, 46 y.o.   MRN: [id number]  PCP: Dorena Dew, FNP  Chief Complaint  Patient presents with  . Rash    ON STOMACH AND BACK    Subjective:  HPI Paula Williams is a 46 y.o. female presents today for blood pressure follow-up, rash on stomach and back.  Current problems: Asthma, HIV, Latent TB, chronic back pain, and fibroids.  Paula Williams presents today for blood pressure recheck today. Continues to deny shortness of breath, headaches, dizziness, or shortness of breath. She is mostly concerned today about a pruritic rash that has been present for 3 weeks on her abdomen and back. Recently seen by Dr. Linus Salmons at infectious disease for which he attributed the rash to uncontrolled HIV infection and recommended benadryl.   She hasn't taken any antihistamine medication although has applied hydrocortisone cream and an generic brand anti-itch cream. The rash has decreased in size, however, the persistent itching remains.   Social History   Social History  . Marital status: Single    Spouse name: N/A  . Number of children: 1  . Years of education: N/A   Occupational History  . Not on file.   Social History Main Topics  . Smoking status: Never Smoker  . Smokeless tobacco: Never Used  . Alcohol use No  . Drug use: No  . Sexual activity: Yes    Partners: Male    Birth control/ protection: Condom     Comment: declined condoms   Other Topics Concern  . Not on file   Social History Narrative  . No narrative on file    Family History  Problem Relation Age of Onset  . Diabetes Mother   . Hypertension Father    Review of Systems See HPI   Patient Active Problem List   Diagnosis Date Noted  . Rash and nonspecific skin eruption 06/12/2016  . Osteoarthritis of both knees 06/19/2015  . Right hip pain 01/02/2015  . History of dizziness 11/13/2014  . Prediabetes 11/13/2014  . Screening examination for venereal disease  07/25/2014  . Chronic back pain greater than 3 months duration 02/23/2014  . Decreased range of motion of intervertebral discs of lumbar spine 12/14/2013  . SOB (shortness of breath) 10/31/2013  . HIV disease ([place]) 08/19/2012  . Anxiety state 08/19/2012  . History of BCG vaccination 08/11/2012  . Fibroids 08/03/2012  . 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 Asthma, chronic 08/03/2012  . G6PD deficiency ([place]) 08/03/2012  . 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 Intramural leiomyoma of uterus 08/03/2012  . Molluscum contagiosum 08/03/2012    Allergies  Allergen Reactions  . Tetracyclines & Related Swelling    Facial swelling  . Toradol [Ketorolac Tromethamine] Swelling    Allergic reaction   . Ampicillin Rash  . Clindamycin/Lincomycin Rash  . Sustiva [Efavirenz] Other (See Comments)    Intolerable nightmares  . Tramadol Swelling    Prior to Admission medications   Medication Sig Start Date End Date Taking? Authorizing Provider  albuterol (PROVENTIL HFA;VENTOLIN HFA) 108 (90 BASE) MCG/ACT inhaler Inhale 2 puffs into the lungs every 6 (six) hours as needed for wheezing or shortness of breath. 08/23/14  Yes Micheline Chapman, NP  Ascorbic Acid (VITAMIN C PO) Take 1 tablet by mouth daily. Reported on 06/19/2015   Yes Historical Provider, MD  beclomethasone (QVAR) 80 MCG/ACT inhaler Inhale 1 puff into the lungs 2 (two) times daily. 11/21/13  Yes Dorena Dew, FNP  Cholecalciferol (  VITAMIN D) 1000 UNITS capsule Take 1 capsule (1,000 Units total) by mouth daily. 08/10/12  Yes Thayer Headings, MD  cyclobenzaprine (FLEXERIL) 10 MG tablet Take 1 tablet (10 mg total) by mouth 2 (two) times daily as needed for muscle spasms. 08/14/15  Yes Dorena Dew, FNP  diphenhydrAMINE (BENADRYL) 25 mg capsule Take 1 capsule (25 mg total) by mouth every 6 (six) hours as needed. 06/12/16  Yes Thayer Headings, MD  elvitegravir-cobicistat-emtricitabine-tenofovir (GENVOYA) 150-150-200-10 MG TABS tablet TAKE 1 TABLET  BY MOUTH DAILY. 06/12/16  Yes Thayer Headings, MD  loratadine (CLARITIN) 10 MG tablet Take 10 mg by mouth daily as needed for allergies. Reported on 06/19/2015   Yes Historical Provider, MD  selenium sulfide (SELSUN) 2.5 % shampoo APPLY EXTERNALLY TO THE AFFECTED AREA DAILY AS NEEDED FOR IRRITATION 05/28/16  Yes Scot Jun, FNP  VITAMIN A PO Take 1 tablet by mouth daily.   Yes Historical Provider, MD    Past Medical, Surgical Family and Social History reviewed and updated.    Objective:   Today's Vitals   06/19/16 1503  BP: 134/86  Pulse: 89  Resp: 16  Temp: 98.6 F (37 C)  TempSrc: Oral  SpO2: 99%  Weight: 223 lb (101.2 kg)  Height: 5\' 6"  (1.676 m)    Wt Readings from Last 3 Encounters:  06/19/16 223 lb (101.2 kg)  06/12/16 224 lb (101.6 kg)  05/28/16 227 lb (103 kg)    Physical Exam  Constitutional: She is oriented to person, place, and time. She appears well-developed and well-nourished.  HENT:  Head: Normocephalic and atraumatic.  Eyes: Pupils are equal, round, and reactive to light.  Neck: Normal range of motion. Neck supple.  Cardiovascular: Normal rate, regular rhythm, normal heart sounds and intact distal pulses.   Pulmonary/Chest: Effort normal and breath sounds normal.  Abdominal: Soft. Bowel sounds are normal.    Musculoskeletal: Normal range of motion.       Arms: Neurological: She is alert and oriented to person, place, and time.  Skin: Skin is warm and dry.  Psychiatric: She has a normal mood and affect. Her behavior is normal. Judgment and thought content normal.     Assessment & Plan:  1. Essential hypertension -Continue to watch and wait presently.  Patient given parameters to return sooner than follow-up if blood pressure is persistently greater than 140/90.  2. Skin rash -Apply Triamcinolone:Eucerin Cream to affected area twice daily -Take Cetirizine 10 mg daily at bedtime to reduce itching.  RTC: 6 months or sooner if needed. We will  complete PAP at next office visit  Carroll Sage. Kenton Kingfisher, MSN, [facility] [address] [place], [place] [address] [phone]

## 2016-06-19 NOTE — Patient Instructions (Signed)
Start Zyrtec 10 mg at bedtime for it's antihistamine action.  Apply triamcinolone cream twice daily to affected areas to suppress itching.  Notify me if this rash hasn't improved within 1 month.

## 2016-06-27 ENCOUNTER — Ambulatory Visit: Payer: Self-pay | Admitting: Family Medicine

## 2016-07-10 ENCOUNTER — Other Ambulatory Visit: Payer: Managed Care, Other (non HMO)

## 2016-07-17 ENCOUNTER — Ambulatory Visit (INDEPENDENT_AMBULATORY_CARE_PROVIDER_SITE_OTHER): Payer: Managed Care, Other (non HMO) | Admitting: Pharmacist

## 2016-07-17 DIAGNOSIS — B20 Human immunodeficiency virus [HIV] disease: Secondary | ICD-10-CM | POA: Diagnosis not present

## 2016-07-17 DIAGNOSIS — Z0000 Encounter for general adult medical examination without abnormal findings: Secondary | ICD-10-CM | POA: Diagnosis not present

## 2016-07-17 LAB — CBC
HCT: 40.1 % (ref 35.0–45.0)
Hemoglobin: 13.2 g/dL (ref 11.7–15.5)
MCH: 29.4 pg (ref 27.0–33.0)
MCHC: 32.9 g/dL (ref 32.0–36.0)
MCV: 89.3 fL (ref 80.0–100.0)
MPV: 10.7 fL (ref 7.5–12.5)
PLATELETS: 218 10*3/uL (ref 140–400)
RBC: 4.49 MIL/uL (ref 3.80–5.10)
RDW: 14.6 % (ref 11.0–15.0)
WBC: 4 10*3/uL (ref 3.8–10.8)

## 2016-07-17 LAB — COMPREHENSIVE METABOLIC PANEL
ALK PHOS: 68 U/L (ref 33–115)
ALT: 15 U/L (ref 6–29)
AST: 15 U/L (ref 10–35)
Albumin: 3.8 g/dL (ref 3.6–5.1)
BILIRUBIN TOTAL: 0.4 mg/dL (ref 0.2–1.2)
BUN: 15 mg/dL (ref 7–25)
CO2: 29 mmol/L (ref 20–31)
CREATININE: 1.08 mg/dL (ref 0.50–1.10)
Calcium: 9.3 mg/dL (ref 8.6–10.2)
Chloride: 102 mmol/L (ref 98–110)
GLUCOSE: 92 mg/dL (ref 65–99)
POTASSIUM: 4.5 mmol/L (ref 3.5–5.3)
SODIUM: 137 mmol/L (ref 135–146)
TOTAL PROTEIN: 7.6 g/dL (ref 6.1–8.1)

## 2016-07-17 MED ORDER — SELENIUM SULFIDE 2.5 % EX LOTN: TOPICAL_LOTION | CUTANEOUS | 0 refills | Status: DC
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
## 2016-07-18 LAB — T-HELPER CELL (CD4) - (RCID CLINIC ONLY)
CD4 % Helper T Cell: 24 % — ABNORMAL LOW (ref 33–55)
CD4 T Cell Abs: 510 /uL (ref 400–2700)

## 2016-07-19 LAB — HIV-1 RNA QUANT-NO REFLEX-BLD
HIV 1 RNA QUANT: NOT DETECTED {copies}/mL
HIV-1 RNA Quant, Log: <1.3 Log copies/mL

## 2016-07-25 ENCOUNTER — Other Ambulatory Visit: Payer: Self-pay | Admitting: Family Medicine

## 2016-07-25 ENCOUNTER — Encounter: Payer: Self-pay | Admitting: Family Medicine

## 2016-07-25 ENCOUNTER — Ambulatory Visit (INDEPENDENT_AMBULATORY_CARE_PROVIDER_SITE_OTHER): Payer: Managed Care, Other (non HMO) | Admitting: Family Medicine

## 2016-07-25 ENCOUNTER — Ambulatory Visit (HOSPITAL_COMMUNITY)
Admission: RE | Admit: 2016-07-25 | Discharge: 2016-07-25 | Disposition: A | Discharge status: Home / Self Care | Payer: Managed Care, Other (non HMO) | Source: Ambulatory Visit | Attending: Family Medicine | Admitting: Family Medicine

## 2016-07-25 VITALS — BP 138/76 | HR 93 | Temp 97.9°F | Ht 66.0 in | Wt 227.0 lb

## 2016-07-25 DIAGNOSIS — R160 Hepatomegaly, not elsewhere classified: Secondary | ICD-10-CM | POA: Diagnosis not present

## 2016-07-25 DIAGNOSIS — R1011 Right upper quadrant pain: Secondary | ICD-10-CM | POA: Diagnosis not present

## 2016-07-25 LAB — POCT URINALYSIS DIP (DEVICE)
Bilirubin Urine: NEGATIVE
Glucose, UA: NEGATIVE mg/dL
HGB URINE DIPSTICK: NEGATIVE
Ketones, ur: NEGATIVE mg/dL
Leukocytes, UA: NEGATIVE
NITRITE: NEGATIVE
PH: 5.5 (ref 5.0–8.0)
PROTEIN: NEGATIVE mg/dL
Specific Gravity, Urine: 1.025 (ref 1.005–1.030)
Urobilinogen, UA: 0.2 mg/dL (ref 0.0–1.0)

## 2016-07-25 MED ORDER — IOPAMIDOL (ISOVUE-300) INJECTION 61%
100.0000 mL | Freq: Once | INTRAVENOUS | Status: AC | PRN
  Administered 2016-07-25: 100 mL via INTRAVENOUS

## 2016-07-25 MED ORDER — IOPAMIDOL (ISOVUE-300) INJECTION 61%
30.0000 mL | Freq: Once | INTRAVENOUS | Status: AC | PRN
  Administered 2016-07-25: 30 mL via ORAL

## 2016-07-25 MED ORDER — ACETAMINOPHEN 325 MG PO TABS: 650.0000 mg | ORAL_TABLET | Freq: Four times a day (QID) | ORAL | 2 refills | Status: AC | PRN

## 2016-07-25 MED ORDER — PREDNISONE 20 MG PO TABS: 60.0000 mg | ORAL_TABLET | Freq: Every day | ORAL | 0 refills | Status: DC

## 2016-07-25 MED ORDER — IOPAMIDOL (ISOVUE-300) INJECTION 61%
INTRAVENOUS | Status: AC
  Filled 2016-07-25: qty 30

## 2016-07-25 MED ORDER — IOPAMIDOL (ISOVUE-300) INJECTION 61%
INTRAVENOUS | Status: AC
  Filled 2016-07-25: qty 100

## 2016-07-25 NOTE — Progress Notes (Signed)
Patient ID: Paula Williams, female    DOB: 28-Aug-1970, 46 y.o.   MRN: [id number]  PCP: Scot Jun, FNP  Chief Complaint  Patient presents with  . pain under right breast    Subjective:  HPI Paula Williams is a 46 y.o. female presents acute upper right quadrant abdominal pain x 1.5 weeks. She was seen and evaluated at a local urgent care on yesterday . An x-ray was unremarkable and findings were only consistent for constipation. Characterizes pain as  throbbing/sharp pain, localized to the right upper quadrant only. Denies fever, however experiences hot and cold flashes. Reports normal bowel movements. Reports a good appetite. Pain is more pronounced with lying flat, lying on right side, and lateral rotational movements.  Pain intensity is unrelated to food intake neither worsens or improves. She has taken ibuprofen without relief of  symptoms.   Social History   Social History  . Marital status: Single    Spouse name: N/A  . Number of children: 1  . Years of education: N/A   Occupational History  . Not on file.   Social History Main Topics  . Smoking status: Never Smoker  . Smokeless tobacco: Never Used  . Alcohol use No  . Drug use: No  . Sexual activity: Yes    Partners: Male    Birth control/ protection: Condom     Comment: declined condoms   Other Topics Concern  . Not on file   Social History Narrative  . No narrative on file    Family History  Problem Relation Age of Onset  . Diabetes Mother   . Hypertension Father    Review of Systems See HPI  Patient Active Problem List   Diagnosis Date Noted  . Rash and nonspecific skin eruption 06/12/2016  . Osteoarthritis of both knees 06/19/2015  . Right hip pain 01/02/2015  . History of dizziness 11/13/2014  . Prediabetes 11/13/2014  . Screening examination for venereal disease 07/25/2014  . Chronic back pain greater than 3 months duration 02/23/2014  . Decreased range of motion of  intervertebral discs of lumbar spine 12/14/2013  . SOB (shortness of breath) 10/31/2013  . HIV disease ([place]) 08/19/2012  . Anxiety state 08/19/2012  . History of BCG vaccination 08/11/2012  . Fibroids 08/03/2012  . 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 Asthma, chronic 08/03/2012  . G6PD deficiency ([place]) 08/03/2012  . 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 Intramural leiomyoma of uterus 08/03/2012  . Molluscum contagiosum 08/03/2012    Allergies  Allergen Reactions  . Tetracyclines & Related Swelling    Facial swelling  . Toradol [Ketorolac Tromethamine] Swelling    Allergic reaction   . Ampicillin Rash  . Clindamycin/Lincomycin Rash  . Sustiva [Efavirenz] Other (See Comments)    Intolerable nightmares  . Tramadol Swelling    Prior to Admission medications   Medication Sig Start Date End Date Taking? Authorizing Provider  albuterol (PROVENTIL HFA;VENTOLIN HFA) 108 (90 BASE) MCG/ACT inhaler Inhale 2 puffs into the lungs every 6 (six) hours as needed for wheezing or shortness of breath. 08/23/14  Yes Micheline Chapman, NP  Ascorbic Acid (VITAMIN C PO) Take 1 tablet by mouth daily. Reported on 06/19/2015   Yes [provider]  beclomethasone (QVAR) 80 MCG/ACT inhaler Inhale 1 puff into the lungs 2 (two) times daily. 11/21/13  Yes Dorena Dew, FNP  cetirizine (ZYRTEC) 10 MG tablet Take 1 tablet (10 mg total) by mouth daily. 06/19/16  Yes Scot Jun, FNP  Cholecalciferol (VITAMIN D) 1000 UNITS capsule Take 1 capsule (1,000 Units total) by mouth daily. 08/10/12  Yes Comer, Okey Regal, MD  cyclobenzaprine (FLEXERIL) 10 MG tablet Take 1 tablet (10 mg total) by mouth 2 (two) times daily as needed for muscle spasms. 08/14/15  Yes Dorena Dew, FNP  diphenhydrAMINE (BENADRYL) 25 mg capsule Take 1 capsule (25 mg total) by mouth every 6 (six) hours as needed. 06/12/16  Yes Comer, Okey Regal, MD  elvitegravir-cobicistat-emtricitabine-tenofovir (GENVOYA) 150-150-200-10 MG TABS tablet  TAKE 1 TABLET BY MOUTH DAILY. 06/12/16  Yes Comer, Okey Regal, MD  loratadine (CLARITIN) 10 MG tablet Take 10 mg by mouth daily as needed for allergies. Reported on 06/19/2015   Yes [provider]  selenium sulfide (SELSUN) 2.5 % shampoo APPLY EXTERNALLY TO THE AFFECTED AREA DAILY AS NEEDED FOR IRRITATION 07/17/16  Yes Kuppelweiser, Cassie L, RPH  triamcinolone cream (KENALOG) 0.1 % Apply 1 application topically 2 (two) times daily. Short-term use only 06/19/16  Yes Scot Jun, FNP  VITAMIN A PO Take 1 tablet by mouth daily.   Yes [provider]    Past Medical, Surgical Family and Social History reviewed and updated.    Objective:   Today's Vitals   07/25/16 1329  BP: 138/76  Pulse: 93  Temp: 97.9 F (36.6 C)  TempSrc: Oral  SpO2: 100%  Weight: 227 lb (103 kg)  Height: 5\' 6"  (1.676 m)    Wt Readings from Last 3 Encounters:  07/25/16 227 lb (103 kg)  06/19/16 223 lb (101.2 kg)  06/12/16 224 lb (101.6 kg)    Physical Exam  Constitutional: She is oriented to person, place, and time. She appears well-developed and well-nourished.  HENT:  Head: Normocephalic and atraumatic.  Right Ear: External ear normal.  Left Ear: External ear normal.  Nose: Nose normal.  Mouth/Throat: Oropharynx is clear and moist.  Eyes: Conjunctivae are normal. Pupils are equal, round, and reactive to light.  Neck: Normal range of motion. Neck supple.  Cardiovascular: Normal rate, regular rhythm, normal heart sounds and intact distal pulses.   Pulmonary/Chest: Effort normal and breath sounds normal.  Abdominal: Soft. Bowel sounds are normal. There is no hepatosplenomegaly. There is tenderness in the right upper quadrant. There is rigidity. There is no CVA tenderness, no tenderness at McBurney's point and negative Murphy's sign.    Musculoskeletal: Normal range of motion.  Neurological: She is alert and oriented to person, place, and time.  Skin: Skin is warm and dry.  Psychiatric:  She has a normal mood and affect. Her behavior is normal. Judgment and thought content normal.    Assessment & Plan:  1. Right upper quadrant abdominal pain - CT ABDOMEN W CONTRAST-STAT  Keesha Pellum S. Kenton Kingfisher, MSN, FNP-C [facility]  [address] [place] [place], [place] [address] [phone]

## 2016-07-25 NOTE — Progress Notes (Signed)
CT was negative of acute findings. Pain likely caused by pleurisy and or unilateral chest wall pain. Starting on 5 days of prednisone and for acute pain 650 mg of tylenol every 4-6 hours. Return for follow-up in 1 week.

## 2016-07-25 NOTE — Patient Instructions (Signed)
Return to office on Monday for follow-up. Radiology will contact me with the results of your CT.    Abdominal Pain, Adult Abdominal pain can be caused by many things. Often, abdominal pain is not serious and it gets better with no treatment or by being treated at home. However, sometimes abdominal pain is serious. Your health care provider will do a medical history and a physical exam to try to determine the cause of your abdominal pain. Follow these instructions at home:  Take over-the-counter and prescription medicines only as told by your health care provider. Do not take a laxative unless told by your health care provider.  Drink enough fluid to keep your urine clear or pale yellow.  Watch your condition for any changes.  Keep all follow-up visits as told by your health care provider. This is important. Contact a health care provider if:  Your abdominal pain changes or gets worse.  You are not hungry or you lose weight without trying.  You are constipated or have diarrhea for more than 2-3 days.  You have pain when you urinate or have a bowel movement.  Your abdominal pain wakes you up at night.  Your pain gets worse with meals, after eating, or with certain foods.  You are throwing up and cannot keep anything down.  You have a fever. Get help right away if:  Your pain does not go away as soon as your health care provider told you to expect.  You cannot stop throwing up.  Your pain is only in areas of the abdomen, such as the right side or the left lower portion of the abdomen.  You have bloody or black stools, or stools that look like tar.  You have severe pain, cramping, or bloating in your abdomen.  You have signs of dehydration, such as: ? Dark urine, very little urine, or no urine. ? Cracked lips. ? Dry mouth. ? Sunken eyes. ? Sleepiness. ? Weakness. This information is not intended to replace advice given to you by your health care provider. Make sure you  discuss any questions you have with your health care provider. Document Released: 11/13/2004 Document Revised: 08/24/2015 Document Reviewed: 07/18/2015 Elsevier Interactive Patient Education  2017 [employer].

## 2016-09-27 ENCOUNTER — Emergency Department (HOSPITAL_COMMUNITY)
Admission: EM | Admit: 2016-09-27 | Discharge: 2016-09-27 | Disposition: A | Discharge status: Home / Self Care | Payer: Managed Care, Other (non HMO) | Attending: Emergency Medicine | Admitting: Emergency Medicine

## 2016-09-27 ENCOUNTER — Ambulatory Visit (HOSPITAL_COMMUNITY)
Admission: EM | Admit: 2016-09-27 | Discharge: 2016-09-27 | Disposition: A | Discharge status: Home / Self Care | Payer: Managed Care, Other (non HMO)

## 2016-09-27 ENCOUNTER — Emergency Department (HOSPITAL_COMMUNITY): Payer: Managed Care, Other (non HMO)

## 2016-09-27 ENCOUNTER — Encounter (HOSPITAL_COMMUNITY): Payer: Self-pay | Admitting: Emergency Medicine

## 2016-09-27 DIAGNOSIS — J45909 Unspecified asthma, uncomplicated: Secondary | ICD-10-CM | POA: Insufficient documentation

## 2016-09-27 DIAGNOSIS — R079 Chest pain, unspecified: Secondary | ICD-10-CM

## 2016-09-27 DIAGNOSIS — R0789 Other chest pain: Secondary | ICD-10-CM | POA: Insufficient documentation

## 2016-09-27 DIAGNOSIS — Z79899 Other long term (current) drug therapy: Secondary | ICD-10-CM | POA: Diagnosis not present

## 2016-09-27 DIAGNOSIS — B20 Human immunodeficiency virus [HIV] disease: Secondary | ICD-10-CM | POA: Insufficient documentation

## 2016-09-27 LAB — CBC
HEMATOCRIT: 37.8 % (ref 36.0–46.0)
Hemoglobin: 12.3 g/dL (ref 12.0–15.0)
MCH: 29.9 pg (ref 26.0–34.0)
MCHC: 32.5 g/dL (ref 30.0–36.0)
MCV: 91.7 fL (ref 78.0–100.0)
Platelets: 208 10*3/uL (ref 150–400)
RBC: 4.12 MIL/uL (ref 3.87–5.11)
RDW: 14.3 % (ref 11.5–15.5)
WBC: 6.8 10*3/uL (ref 4.0–10.5)

## 2016-09-27 LAB — BASIC METABOLIC PANEL
Anion gap: 10 (ref 5–15)
BUN: 10 mg/dL (ref 6–20)
CHLORIDE: 103 mmol/L (ref 101–111)
CO2: 25 mmol/L (ref 22–32)
Calcium: 9 mg/dL (ref 8.9–10.3)
Creatinine, Ser: 1.06 mg/dL — ABNORMAL HIGH (ref 0.44–1.00)
GFR calc Af Amer: >60 mL/min (ref >60)
GFR calc non Af Amer: >60 mL/min (ref >60)
GLUCOSE: 119 mg/dL — AB (ref 65–99)
POTASSIUM: 4.6 mmol/L (ref 3.5–5.1)
Sodium: 138 mmol/L (ref 135–145)

## 2016-09-27 LAB — I-STAT TROPONIN, ED: Troponin i, poc: 0 ng/mL (ref 0.00–0.08)

## 2016-09-27 LAB — I-STAT BETA HCG BLOOD, ED (MC, WL, AP ONLY): I-stat hCG, quantitative: <5 m[IU]/mL (ref <5)

## 2016-09-27 LAB — RAPID STREP SCREEN (MED CTR MEBANE ONLY): Streptococcus, Group A Screen (Direct): NEGATIVE

## 2016-09-27 MED ORDER — HYDROCODONE-ACETAMINOPHEN 5-325 MG PO TABS: ORAL_TABLET | ORAL | 0 refills | Status: DC

## 2016-09-27 MED ORDER — IOPAMIDOL (ISOVUE-370) INJECTION 76%
INTRAVENOUS | Status: AC
  Administered 2016-09-27: 100 mL
  Filled 2016-09-27: qty 100

## 2016-09-27 MED ORDER — MORPHINE SULFATE (PF) 4 MG/ML IV SOLN
4.0000 mg | Freq: Once | INTRAVENOUS | Status: AC
  Administered 2016-09-27: 4 mg via INTRAVENOUS
  Filled 2016-09-27: qty 1

## 2016-09-27 NOTE — ED Notes (Signed)
Purcell Nails, NP had conversation with this patient.  Patient did leave.

## 2016-09-27 NOTE — Discharge Instructions (Addendum)
For pain control please take ibuprofen (also known as Motrin or Advil) 800mg  (this is normally 4 over the counter pills) 3 times a day  for 5 days. Take with food to minimize stomach irritation.   Take vicodin for breakthrough pain, do not drink alcohol, drive, care for children or do other critical tasks while taking vicodin.  Take medications as directed.  Follow-up with your primary care doctor next 24-48 hours for further evaluation.  Return the emergency Department for any worsening chest pain, difficult to breathing, fever, nausea/vomiting, abdominal pain or any other worsening or concerning symptoms.
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
## 2016-09-27 NOTE — ED Notes (Signed)
Pt states sore throat for 2 days. New onset centralized chest pain starting today, "cutting" feeling from throat to chest. Denies nausea, shortness of breath or associated symptoms.

## 2016-09-27 NOTE — ED Provider Notes (Signed)
PROGRESS NOTE                                                                                                                 This is a sign-out from [place] at shift change: Paula Williams is a 46 y.o. female with PMH significant for HIV (wel lcontroled) and GERD presenting with plueritic CP, DOE, . Plan is CTA Please refer to previous note for full HPI, ROS, PMH and PE.   CTA negative, updated patient, recommends high-dose ibuprofen in addition to Vicodin, will follow with primary care.       Paula Williams 09/27/16 Dawson, [place], DO 09/28/16 [phone]

## 2016-09-27 NOTE — ED Notes (Signed)
IV attempted x2 with no success.

## 2016-09-29 LAB — CULTURE, GROUP A STREP (THRC)

## 2016-10-14 ENCOUNTER — Encounter: Payer: Self-pay | Admitting: Internal Medicine

## 2016-10-14 ENCOUNTER — Ambulatory Visit (INDEPENDENT_AMBULATORY_CARE_PROVIDER_SITE_OTHER): Payer: Managed Care, Other (non HMO) | Admitting: Internal Medicine

## 2016-10-14 VITALS — BP 162/98 | HR 76 | Temp 98.0°F | Ht 66.0 in | Wt 227.0 lb

## 2016-10-14 DIAGNOSIS — B20 Human immunodeficiency virus [HIV] disease: Secondary | ICD-10-CM | POA: Diagnosis not present

## 2016-10-14 DIAGNOSIS — Z0000 Encounter for general adult medical examination without abnormal findings: Secondary | ICD-10-CM

## 2016-10-14 MED ORDER — ELVITEG-COBIC-EMTRICIT-TENOFAF 150-150-200-10 MG PO TABS: ORAL_TABLET | ORAL | 11 refills | Status: DC

## 2016-10-14 MED ORDER — SELENIUM SULFIDE 2.5 % EX LOTN: TOPICAL_LOTION | CUTANEOUS | 5 refills | Status: DC

## 2016-10-14 NOTE — Assessment & Plan Note (Signed)
Doing well.  Labs today and if ok, rtc 6 months.

## 2016-10-14 NOTE — Assessment & Plan Note (Signed)
Will have her scheduled for PAP.  Patient very reluctant but I emphasized the importance

## 2016-10-14 NOTE — Progress Notes (Signed)
   Subjective:    Patient ID: Paula Williams, female    DOB: Oct 14, 1970, 46 y.o.   MRN: [id number]  HPI Here for follow up of HIV Has restarted Genvoya after stopping for about 4 months when switching form [employer] to insurance.  No missed doses.  Last viral load < 20.  CD4 was 510.  No associated n/v/d.    Review of Systems  Constitutional: Negative for fatigue.  Gastrointestinal: Negative for diarrhea.  Skin: Negative for rash.       Objective:   Physical Exam  Constitutional: She appears well-developed and well-nourished. No distress.  HENT:  Mouth/Throat: No oropharyngeal exudate.  Eyes: No scleral icterus.  Cardiovascular: Normal rate, regular rhythm and normal heart sounds.   No murmur heard. Pulmonary/Chest: Effort normal and breath sounds normal. No respiratory distress.  Skin: No rash noted.          Assessment & Plan:

## 2016-10-15 LAB — T-HELPER CELL (CD4) - (RCID CLINIC ONLY)
CD4 % Helper T Cell: 24 % — ABNORMAL LOW (ref 33–55)
CD4 T Cell Abs: 550 /uL (ref 400–2700)

## 2016-10-16 LAB — HIV-1 RNA QUANT-NO REFLEX-BLD
HIV 1 RNA Quant: 38 {copies}/mL — ABNORMAL HIGH
HIV-1 RNA Quant, Log: 1.58 {Log_copies}/mL — ABNORMAL HIGH

## 2016-11-19 ENCOUNTER — Encounter: Payer: Self-pay | Admitting: Family Medicine

## 2016-11-19 ENCOUNTER — Ambulatory Visit (INDEPENDENT_AMBULATORY_CARE_PROVIDER_SITE_OTHER): Payer: Managed Care, Other (non HMO) | Admitting: Family Medicine

## 2016-11-19 VITALS — BP 144/82 | HR 87 | Temp 98.2°F | Resp 16 | Ht 66.0 in | Wt 234.0 lb

## 2016-11-19 DIAGNOSIS — M7989 Other specified soft tissue disorders: Secondary | ICD-10-CM

## 2016-11-19 DIAGNOSIS — J069 Acute upper respiratory infection, unspecified: Secondary | ICD-10-CM

## 2016-11-19 MED ORDER — GUAIFENESIN-CODEINE 100-6.3 MG/5ML PO SOLN: 5.0000 mL | Freq: Four times a day (QID) | ORAL | 0 refills | Status: DC | PRN

## 2016-11-19 MED ORDER — IPRATROPIUM BROMIDE 0.03 % NA SOLN: 2.0000 | Freq: Two times a day (BID) | NASAL | 0 refills | Status: DC

## 2016-11-19 MED ORDER — POTASSIUM CHLORIDE ER 10 MEQ PO TBCR: 10.0000 meq | EXTENDED_RELEASE_TABLET | Freq: Every day | ORAL | 0 refills | Status: DC

## 2016-11-19 MED ORDER — FUROSEMIDE 20 MG PO TABS: 20.0000 mg | ORAL_TABLET | Freq: Every day | ORAL | 0 refills | Status: DC | PRN

## 2016-11-19 MED ORDER — GUAIFENESIN-CODEINE 100-10 MG/5ML PO SYRP: 5.0000 mL | ORAL_SOLUTION | Freq: Three times a day (TID) | ORAL | 0 refills | Status: DC | PRN

## 2016-11-19 MED ORDER — AZITHROMYCIN 250 MG PO TABS: ORAL_TABLET | ORAL | 0 refills | Status: DC

## 2016-11-19 NOTE — Progress Notes (Signed)
Patient ID: Paula Williams, female    DOB: Oct 04, 1970, 46 y.o.   MRN: [id number]  PCP: Scot Jun, FNP  Chief Complaint  Patient presents with  . Cough  . Sore Throat  . Nasal Congestion    Subjective:  HPI Paula Williams is a 46 y.o. female with HIV, hx latent TB, presents for evaluation of upper respiratory symptoms. [name] complains of  sore throat, cough, and nasal congestion X 1 day.  No known sick contacts. Reports a low grade temp of 99.7 last night. She has taken Tylenol today and has not rechecked temperature since that time. Cough has been non-productive. She has experienced headache behind the eyes since illness precipitated yesterday. Denies chest tightness, wheezing, or shortness of breath. Social History   Social History  . Marital status: Single    Spouse name: N/A  . Number of children: 1  . Years of education: N/A   Occupational History  . Not on file.   Social History Main Topics  . Smoking status: Never Smoker  . Smokeless tobacco: Never Used  . Alcohol use No  . Drug use: No  . Sexual activity: Yes    Partners: Male    Birth control/ protection: Condom     Comment: declined condoms   Other Topics Concern  . Not on file   Social History Narrative  . No narrative on file    Family History  Problem Relation Age of Onset  . Diabetes Mother   . Hypertension Father    Review of Systems See history of present illness Patient Active Problem List   Diagnosis Date Noted  . Healthcare maintenance 10/14/2016  . Rash and nonspecific skin eruption 06/12/2016  . Osteoarthritis of both knees 06/19/2015  . Right hip pain 01/02/2015  . History of dizziness 11/13/2014  . Prediabetes 11/13/2014  . Screening examination for venereal disease 07/25/2014  . Chronic back pain greater than 3 months duration 02/23/2014  . Decreased range of motion of intervertebral discs of lumbar spine 12/14/2013  . SOB (shortness of breath) 10/31/2013  . HIV  disease ([place]) 08/19/2012  . Anxiety state 08/19/2012  . History of BCG vaccination 08/11/2012  . Fibroids 08/03/2012  . 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 Asthma, chronic 08/03/2012  . G6PD deficiency ([place]) 08/03/2012  . 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 Intramural leiomyoma of uterus 08/03/2012  . Molluscum contagiosum 08/03/2012    Allergies  Allergen Reactions  . Tetracyclines & Related Swelling    Facial swelling  . Toradol [Ketorolac Tromethamine] Swelling       . Ampicillin Rash  . Clindamycin/Lincomycin Rash  . Sustiva [Efavirenz] Other (See Comments)    Intolerable nightmares  . Tramadol Swelling    Prior to Admission medications   Medication Sig Start Date End Date Taking? Authorizing Provider  acetaminophen (TYLENOL) 325 MG tablet Take 2 tablets (650 mg total) by mouth every 6 (six) hours as needed for mild pain or moderate pain. Patient taking differently: Take 650 mg by mouth every 6 (six) hours as needed (pain/menstrual cramps).  07/25/16 07/25/17 Yes Scot Jun, FNP  albuterol (PROVENTIL HFA;VENTOLIN HFA) 108 (90 BASE) MCG/ACT inhaler Inhale 2 puffs into the lungs every 6 (six) hours as needed for wheezing or shortness of breath. 08/23/14  Yes Micheline Chapman, NP  Ascorbic Acid (VITAMIN C PO) Take 1 tablet by mouth daily. Reported on 06/19/2015   Yes [provider]  beclomethasone (QVAR) 80 MCG/ACT inhaler Inhale 1 puff into the  lungs 2 (two) times daily. Patient taking differently: Inhale 1 puff into the lungs 2 (two) times daily as needed (shortness of breath/wheezing).  11/21/13  Yes Dorena Dew, FNP  cetirizine (ZYRTEC) 10 MG tablet Take 1 tablet (10 mg total) by mouth daily. Patient taking differently: Take 10 mg by mouth daily as needed for allergies or rhinitis.  06/19/16  Yes Scot Jun, FNP  Cholecalciferol (VITAMIN D) 1000 UNITS capsule Take 1 capsule (1,000 Units total) by mouth daily. 08/10/12  Yes Comer, Okey Regal, MD   cyclobenzaprine (FLEXERIL) 10 MG tablet Take 1 tablet (10 mg total) by mouth 2 (two) times daily as needed for muscle spasms. 08/14/15  Yes Dorena Dew, FNP  diphenhydrAMINE (BENADRYL) 25 mg capsule Take 1 capsule (25 mg total) by mouth every 6 (six) hours as needed. Patient taking differently: Take 25 mg by mouth every 6 (six) hours as needed for allergies.  06/12/16  Yes Comer, Okey Regal, MD  elvitegravir-cobicistat-emtricitabine-tenofovir (GENVOYA) 150-150-200-10 MG TABS tablet TAKE 1 TABLET BY MOUTH DAILY. 10/14/16  Yes Comer, Okey Regal, MD  HYDROcodone-acetaminophen (NORCO/VICODIN) 5-325 MG tablet Take 1-2 tablets by mouth every 6 hours as needed for pain and/or cough. 09/27/16  Yes Pisciotta, Elmyra Ricks, PA-C  ibuprofen (ADVIL,MOTRIN) 800 MG tablet Take 800 mg by mouth every 8 (eight) hours as needed (pain/menstrual cramps).   Yes [provider]  selenium sulfide (SELSUN) 2.5 % shampoo APPLY EXTERNALLY TO THE AFFECTED AREA DAILY AS NEEDED FOR IRRITATION 10/14/16  Yes Comer, Okey Regal, MD  VITAMIN A PO Take 1 capsule by mouth daily.    Yes [provider]    Past Medical, Surgical Family and Social History reviewed and updated.    Objective:   Today's Vitals   11/19/16 1057  BP: (!) 144/82  Pulse: 87  Resp: 16  Temp: 98.2 F (36.8 C)  TempSrc: Oral  SpO2: 99%  Weight: 234 lb (106.1 kg)  Height: 5\' 6"  (1.676 m)    Wt Readings from Last 3 Encounters:  11/19/16 234 lb (106.1 kg)  10/14/16 227 lb (103 kg)  07/25/16 227 lb (103 kg)    Physical Exam  Constitutional: She is oriented to person, place, and time. She appears well-developed and well-nourished. She has a sickly appearance.  HENT:  Right Ear: Hearing, tympanic membrane, external ear and ear canal normal.  Left Ear: Hearing, tympanic membrane, external ear and ear canal normal.  Nose: Mucosal edema and rhinorrhea present.  Mouth/Throat: No oropharyngeal exudate, posterior oropharyngeal edema or posterior  oropharyngeal erythema.  Watery eyes, generalized facial tenderness  Neck: Normal range of motion. Neck supple.  Cardiovascular: Normal rate, regular rhythm, normal heart sounds and intact distal pulses.   Pulmonary/Chest: Effort normal and breath sounds normal.  Musculoskeletal: She exhibits edema.  Bilateral feet edema, nonpitting  Neurological: She is alert and oriented to person, place, and time.  Skin: Skin is warm and dry.  Psychiatric: She has a normal mood and affect. Her behavior is normal.   Assessment & Plan:  1. Recurrent upper respiratory infection (URI), likely viral. Recommended symptomatic treatment initially with Atrovent nasal spray to improve symptoms of nasal congestion. Recommended over-the-counter cetirizine or Allegra to relieve congestion. Increase hydration and rest. If no improvement in 3 days, patient given a printed prescription to fill for azithromycin.  2. Bilateral swelling of feet, bilateral nonpitting edema of both feet -Reduce sodium intake.  -Adding furosemide 20 mg as needed daily lower extremity swelling. Patient advised to take  10 mEq of potassium with each dose of furosemide.  Return for care as scheduled follow-up of 12/22/2016.   Carroll Sage. Kenton Kingfisher, MSN, FNP-C [facility]  [address] [place], [place] [address] [phone]

## 2016-11-19 NOTE — Patient Instructions (Signed)
For nasal congestion-start Atrovent 2 sprays to both nostril twice daily as needed for congestion.  Fill antibiotic, Azithromycin Friday if no improvement of symptoms  For feet swelling reduce sodium intake, obtain compression socks, and take furosemide as needed for swelling. To prevent loss of potassium, take 1 10 meq potassium tablet with each dose of furosemide.

## 2016-12-22 ENCOUNTER — Ambulatory Visit: Payer: Managed Care, Other (non HMO) | Admitting: Family Medicine

## 2017-03-18 ENCOUNTER — Ambulatory Visit (INDEPENDENT_AMBULATORY_CARE_PROVIDER_SITE_OTHER): Payer: Managed Care, Other (non HMO) | Admitting: Family Medicine

## 2017-03-18 ENCOUNTER — Encounter: Payer: Self-pay | Admitting: Family Medicine

## 2017-03-18 VITALS — BP 140/72 | HR 98 | Temp 98.6°F | Resp 16 | Ht 66.0 in | Wt 232.0 lb

## 2017-03-18 DIAGNOSIS — R509 Fever, unspecified: Secondary | ICD-10-CM | POA: Diagnosis not present

## 2017-03-18 DIAGNOSIS — R6889 Other general symptoms and signs: Secondary | ICD-10-CM | POA: Diagnosis not present

## 2017-03-18 LAB — POCT INFLUENZA A/B
INFLUENZA A, POC: NEGATIVE
INFLUENZA B, POC: NEGATIVE

## 2017-03-18 MED ORDER — IPRATROPIUM BROMIDE 0.03 % NA SOLN: 2.0000 | Freq: Two times a day (BID) | NASAL | 0 refills | Status: AC

## 2017-03-18 MED ORDER — ALBUTEROL SULFATE HFA 108 (90 BASE) MCG/ACT IN AERS: 2.0000 | INHALATION_SPRAY | RESPIRATORY_TRACT | 1 refills | Status: DC | PRN

## 2017-03-18 MED ORDER — CETIRIZINE HCL 10 MG PO TABS: 10.0000 mg | ORAL_TABLET | Freq: Every day | ORAL | 11 refills | Status: DC

## 2017-03-18 MED ORDER — GUAIFENESIN-CODEINE 100-10 MG/5ML PO SYRP: 5.0000 mL | ORAL_SOLUTION | Freq: Three times a day (TID) | ORAL | 0 refills | Status: DC | PRN

## 2017-03-18 MED ORDER — PREDNISONE 20 MG PO TABS: 40.0000 mg | ORAL_TABLET | Freq: Every day | ORAL | 0 refills | Status: DC

## 2017-03-18 NOTE — Patient Instructions (Signed)
Take all medications as prescribed. If fever continues, continue to treat with tylenol.  If symptoms worsen or do improve within 7 days, return for follow-up.   Viral Illness, Adult Viruses are tiny germs that can get into a person's body and cause illness. There are many different types of viruses, and they cause many types of illness. Viral illnesses can range from mild to severe. They can affect various parts of the body. Common illnesses that are caused by a virus include colds and the flu. Viral illnesses also include serious conditions such as HIV/AIDS (human immunodeficiency virus/acquired immunodeficiency syndrome). A few viruses have been linked to certain cancers. What are the causes? Many types of viruses can cause illness. Viruses invade cells in your body, multiply, and cause the infected cells to malfunction or die. When the cell dies, it releases more of the virus. When this happens, you develop symptoms of the illness, and the virus continues to spread to other cells. If the virus takes over the function of the cell, it can cause the cell to divide and grow out of control, as is the case when a virus causes cancer. Different viruses get into the body in different ways. You can get a virus by:  Swallowing food or water that is contaminated with the virus.  Breathing in droplets that have been coughed or sneezed into the air by an infected person.  Touching a surface that has been contaminated with the virus and then touching your eyes, nose, or mouth.  Being bitten by an insect or animal that carries the virus.  Having sexual contact with a person who is infected with the virus.  Being exposed to blood or fluids that contain the virus, either through an open cut or during a transfusion.  If a virus enters your body, your body's defense system (immune system) will try to fight the virus. You may be at higher risk for a viral illness if your immune system is weak. What are the  signs or symptoms? Symptoms vary depending on the type of virus and the location of the cells that it invades. Common symptoms of the main types of viral illnesses include: Cold and flu viruses  Fever.  Headache.  Sore throat.  Muscle aches.  Nasal congestion.  Cough. Digestive system (gastrointestinal) viruses  Fever.  Abdominal pain.  Nausea.  Diarrhea. Liver viruses (hepatitis)  Loss of appetite.  Tiredness.  Yellowing of the skin (jaundice). Brain and spinal cord viruses  Fever.  Headache.  Stiff neck.  Nausea and vomiting.  Confusion or sleepiness. Skin viruses  Warts.  Itching.  Rash. Sexually transmitted viruses  Discharge.  Swelling.  Redness.  Rash. How is this treated? Viruses can be difficult to treat because they live within cells. Antibiotic medicines do not treat viruses because these drugs do not get inside cells. Treatment for a viral illness may include:  Resting and drinking plenty of fluids.  Medicines to relieve symptoms. These can include over-the-counter medicine for pain and fever, medicines for cough or congestion, and medicines to relieve diarrhea.  Antiviral medicines. These drugs are available only for certain types of viruses. They may help reduce flu symptoms if taken early. There are also many antiviral medicines for hepatitis and HIV/AIDS.  Some viral illnesses can be prevented with vaccinations. A common example is the flu shot. Follow these instructions at home: Medicines   Take over-the-counter and prescription medicines only as told by your health care provider.  If you were prescribed  an antiviral medicine, take it as told by your health care provider. Do not stop taking the medicine even if you start to feel better.  Be aware of when antibiotics are needed and when they are not needed. Antibiotics do not treat viruses. If your health care provider thinks that you may have a bacterial infection as well as  a viral infection, you may get an antibiotic. ? Do not ask for an antibiotic prescription if you have been diagnosed with a viral illness. That will not make your illness go away faster. ? Frequently taking antibiotics when they are not needed can lead to antibiotic resistance. When this develops, the medicine no longer works against the bacteria that it normally fights. General instructions  Drink enough fluids to keep your urine clear or pale yellow.  Rest as much as possible.  Return to your normal activities as told by your health care provider. Ask your health care provider what activities are safe for you.  Keep all follow-up visits as told by your health care provider. This is important. How is this prevented? Take these actions to reduce your risk of viral infection:  Eat a healthy diet and get enough rest.  Wash your hands often with soap and water. This is especially important when you are in public places. If soap and water are not available, use hand sanitizer.  Avoid close contact with friends and family who have a viral illness.  If you travel to areas where viral gastrointestinal infection is common, avoid drinking water or eating raw food.  Keep your immunizations up to date. Get a flu shot every year as told by your health care provider.  Do not share toothbrushes, nail clippers, razors, or needles with other people.  Always practice safe sex.  Contact a health care provider if:  You have symptoms of a viral illness that do not go away.  Your symptoms come back after going away.  Your symptoms get worse. Get help right away if:  You have trouble breathing.  You have a severe headache or a stiff neck.  You have severe vomiting or abdominal pain. This information is not intended to replace advice given to you by your health care provider. Make sure you discuss any questions you have with your health care provider. Document Released: 06/15/2015 Document  Revised: 07/18/2015 Document Reviewed: 06/15/2015 Elsevier Interactive Patient Education  [employer].

## 2017-03-18 NOTE — Progress Notes (Signed)
Patient ID: Paula Williams, female    DOB: 05-22-1970, 47 y.o.   MRN: [id number]  PCP: Scot Jun, FNP  Chief Complaint  Patient presents with  . Cough  . Sinusitis  . Nasal Congestion    Subjective:  HPI Paula Williams is a 47 y.o. female with HIV, presents today with a complaint of two days of nasal congestion, productive cough, nasal congestion. Symptoms include headache, facial pain, fatigue, fever 101.0, sweating, coughing, shortness of breath, wheezing, and chest tightness. Onset of these symptoms approximately 2 days prior. No prior history of influenza. Reports a history of pneumonia several year ago. She is immunosuppressed-HIV well controlled in the past. She has taken tylenol and drink tea only for symptoms. Social History   Socioeconomic History  . Marital status: Single    Spouse name: Not on file  . Number of children: 1  . Years of education: Not on file  . Highest education level: Not on file  Social Needs  . Financial resource strain: Not on file  . Food insecurity - worry: Not on file  . Food insecurity - inability: Not on file  . Transportation needs - medical: Not on file  . Transportation needs - non-medical: Not on file  Occupational History  . Not on file  Tobacco Use  . Smoking status: Never Smoker  . Smokeless tobacco: Never Used  Substance and Sexual Activity  . Alcohol use: No    Alcohol/week: 0.0 oz  . Drug use: No  . Sexual activity: Yes    Partners: Male    Birth control/protection: Condom    Comment: declined condoms  Other Topics Concern  . Not on file  Social History Narrative  . Not on file    Family History  Problem Relation Age of Onset  . Diabetes Mother   . Hypertension Father    Review of Systems  Constitutional: Positive for fatigue.  HENT: Positive for congestion, sinus pressure and sinus pain.        Facial pain  Respiratory: Positive for chest tightness and shortness of breath. Negative for wheezing.    Cardiovascular: Negative.   Neurological: Positive for headaches.  Psychiatric/Behavioral: Negative.     Patient Active Problem List   Diagnosis Date Noted  . Healthcare maintenance 10/14/2016  . Rash and nonspecific skin eruption 06/12/2016  . Osteoarthritis of both knees 06/19/2015  . Right hip pain 01/02/2015  . History of dizziness 11/13/2014  . Prediabetes 11/13/2014  . Screening examination for venereal disease 07/25/2014  . Chronic back pain greater than 3 months duration 02/23/2014  . Decreased range of motion of intervertebral discs of lumbar spine 12/14/2013  . SOB (shortness of breath) 10/31/2013  . HIV disease ([place]) 08/19/2012  . Anxiety state 08/19/2012  . History of BCG vaccination 08/11/2012  . Fibroids 08/03/2012  . 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 Asthma, chronic 08/03/2012  . G6PD deficiency ([place]) 08/03/2012  . 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 Intramural leiomyoma of uterus 08/03/2012  . Molluscum contagiosum 08/03/2012    Allergies  Allergen Reactions  . Tetracyclines & Related Swelling    Facial swelling  . Toradol [Ketorolac Tromethamine] Swelling       . Ampicillin Rash  . Clindamycin/Lincomycin Rash  . Sustiva [Efavirenz] Other (See Comments)    Intolerable nightmares  . Tramadol Swelling    Prior to Admission medications   Medication Sig Start Date End Date Taking? Authorizing Provider  albuterol (PROVENTIL HFA;VENTOLIN HFA) 108 (90 BASE) MCG/ACT inhaler  Inhale 2 puffs into the lungs every 6 (six) hours as needed for wheezing or shortness of breath. 08/23/14  Yes Micheline Chapman, NP  Ascorbic Acid (VITAMIN C PO) Take 1 tablet by mouth daily. Reported on 06/19/2015   Yes [provider]  azithromycin (ZITHROMAX) 250 MG tablet Take 2 tabs PO x 1 dose, then 1 tab PO QD x 4 days 11/19/16  Yes Scot Jun, FNP  beclomethasone (QVAR) 80 MCG/ACT inhaler Inhale 1 puff into the lungs 2 (two) times daily. Patient taking differently:  Inhale 1 puff into the lungs 2 (two) times daily as needed (shortness of breath/wheezing).  11/21/13  Yes Dorena Dew, FNP  cetirizine (ZYRTEC) 10 MG tablet Take 1 tablet (10 mg total) by mouth daily. Patient taking differently: Take 10 mg by mouth daily as needed for allergies or rhinitis.  06/19/16  Yes Scot Jun, FNP  Cholecalciferol (VITAMIN D) 1000 UNITS capsule Take 1 capsule (1,000 Units total) by mouth daily. 08/10/12  Yes Comer, Okey Regal, MD  cyclobenzaprine (FLEXERIL) 10 MG tablet Take 1 tablet (10 mg total) by mouth 2 (two) times daily as needed for muscle spasms. 08/14/15  Yes Dorena Dew, FNP  elvitegravir-cobicistat-emtricitabine-tenofovir (GENVOYA) 150-150-200-10 MG TABS tablet TAKE 1 TABLET BY MOUTH DAILY. 10/14/16  Yes Comer, Okey Regal, MD  HYDROcodone-acetaminophen (NORCO/VICODIN) 5-325 MG tablet Take 1-2 tablets by mouth every 6 hours as needed for pain and/or cough. 09/27/16  Yes Pisciotta, Elmyra Ricks, PA-C  ibuprofen (ADVIL,MOTRIN) 800 MG tablet Take 800 mg by mouth every 8 (eight) hours as needed (pain/menstrual cramps).   Yes [provider]  ipratropium (ATROVENT) 0.03 % nasal spray Place 2 sprays into both nostrils 2 (two) times daily. 11/19/16  Yes Scot Jun, FNP  potassium chloride (K-DUR) 10 MEQ tablet Take 1 tablet (10 mEq total) by mouth daily. 11/19/16  Yes Scot Jun, FNP  selenium sulfide (SELSUN) 2.5 % shampoo APPLY EXTERNALLY TO THE AFFECTED AREA DAILY AS NEEDED FOR IRRITATION 10/14/16  Yes Comer, Okey Regal, MD  VITAMIN A PO Take 1 capsule by mouth daily.    Yes [provider]  acetaminophen (TYLENOL) 325 MG tablet Take 2 tablets (650 mg total) by mouth every 6 (six) hours as needed for mild pain or moderate pain. Patient not taking: Reported on 03/18/2017 07/25/16 07/25/17  Scot Jun, FNP  diphenhydrAMINE (BENADRYL) 25 mg capsule Take 1 capsule (25 mg total) by mouth every 6 (six) hours as needed. Patient not taking:  Reported on 03/18/2017 06/12/16   Thayer Headings, MD  furosemide (LASIX) 20 MG tablet Take 1 tablet (20 mg total) by mouth daily as needed. Patient not taking: Reported on 03/18/2017 11/19/16   Scot Jun, FNP  guaiFENesin-codeine (GUAIFENESIN AC) 100-10 MG/5ML syrup Take 5 mLs by mouth every 8 (eight) hours as needed for cough. Patient not taking: Reported on 03/18/2017 11/19/16   Scot Jun, FNP    Past Medical, Surgical Family and Social History reviewed and updated.    Objective:   Today's Vitals   03/18/17 0953  BP: 140/72  Pulse: 98  Resp: 16  Temp: 98.6 F (37 C)  TempSrc: Oral  SpO2: 100%  Weight: 232 lb (105.2 kg)  Height: 5\' 6"  (1.676 m)    Wt Readings from Last 3 Encounters:  03/18/17 232 lb (105.2 kg)  11/19/16 234 lb (106.1 kg)  10/14/16 227 lb (103 kg)    Physical Exam  Constitutional: She is oriented to  person, place, and time. She appears well-developed and well-nourished.  HENT:  Head: Normocephalic and atraumatic.  Right Ear: External ear normal.  Left Ear: External ear normal.  Nose: Mucosal edema and rhinorrhea present. Right sinus exhibits frontal sinus tenderness. Left sinus exhibits frontal sinus tenderness.  Mouth/Throat: Uvula is midline, oropharynx is clear and moist and mucous membranes are normal.  Eyes: Conjunctivae are normal. Pupils are equal, round, and reactive to light.  Neck: Normal range of motion. Neck supple.  Cardiovascular: Normal rate and regular rhythm.  Pulmonary/Chest: Effort normal and breath sounds normal.  Abdominal: Soft. Bowel sounds are normal.  Musculoskeletal: Normal range of motion.  Lymphadenopathy:    She has no cervical adenopathy.  Neurological: She is alert and oriented to person, place, and time.  Psychiatric: She has a normal mood and affect. Her behavior is normal. Judgment and thought content normal.  Vitals reviewed.   Assessment & Plan:  1. Flu-like symptoms 2. Fever, unspecified fever  cause Will treat symptomatically for now as symptoms have only been present for two days therefore antibiotic are not indicated. POCT Influenza A/B negative  Meds ordered this encounter  Medications  . cetirizine (ZYRTEC) 10 MG tablet    Sig: Take 1 tablet (10 mg total) by mouth daily.    Dispense:  30 tablet    Refill:  11    Order Specific Question:   Supervising Provider    Answer:   Tresa Garter [id number]  . ipratropium (ATROVENT) 0.03 % nasal spray    Sig: Place 2 sprays into both nostrils 2 (two) times daily.    Dispense:  30 mL    Refill:  0    Order Specific Question:   Supervising Provider    Answer:   Tresa Garter [id number]  . guaiFENesin-codeine (GUAIFENESIN AC) 100-10 MG/5ML syrup    Sig: Take 5 mLs by mouth every 8 (eight) hours as needed for cough.    Dispense:  120 mL    Refill:  0    Order Specific Question:   Supervising Provider    Answer:   Tresa Garter [id number]  . predniSONE (DELTASONE) 20 MG tablet    Sig: Take 2 tablets (40 mg total) by mouth daily with breakfast.    Dispense:  10 tablet    Refill:  0    Order Specific Question:   Supervising Provider    Answer:   Tresa Garter [[id number]]    Orders Placed This Encounter  Procedures  . POCT Influenza A/B    RTC: If symptoms worsen or do not improve please return for care.  Carroll Sage. Kenton Kingfisher, MSN, FNP-C [facility]  [address] [place], [place] [address] [phone]

## 2017-04-01 ENCOUNTER — Ambulatory Visit: Payer: Managed Care, Other (non HMO) | Admitting: Family Medicine

## 2017-04-01 ENCOUNTER — Other Ambulatory Visit: Payer: Self-pay | Admitting: Family Medicine

## 2017-04-01 MED ORDER — AZITHROMYCIN 250 MG PO TABS: ORAL_TABLET | ORAL | 0 refills | Status: DC

## 2017-04-01 MED ORDER — BENZONATATE 100 MG PO CAPS: 100.0000 mg | ORAL_CAPSULE | Freq: Three times a day (TID) | ORAL | 0 refills | Status: DC | PRN

## 2017-04-01 NOTE — Progress Notes (Signed)
Patient symptoms not improved from prior tx of URI. E-prescribed Azithromycin and Benzonatate.

## 2017-05-04 ENCOUNTER — Encounter: Payer: Self-pay | Admitting: Family Medicine

## 2017-05-04 ENCOUNTER — Other Ambulatory Visit: Payer: Self-pay

## 2017-05-04 ENCOUNTER — Other Ambulatory Visit: Payer: Self-pay | Admitting: Internal Medicine

## 2017-05-04 ENCOUNTER — Ambulatory Visit (INDEPENDENT_AMBULATORY_CARE_PROVIDER_SITE_OTHER): Payer: Managed Care, Other (non HMO) | Admitting: Family Medicine

## 2017-05-04 VITALS — BP 140/84 | HR 61 | Temp 98.2°F | Ht 66.0 in | Wt 230.0 lb

## 2017-05-04 DIAGNOSIS — M5136 Other intervertebral disc degeneration, lumbar region: Secondary | ICD-10-CM

## 2017-05-04 DIAGNOSIS — R7303 Prediabetes: Secondary | ICD-10-CM | POA: Diagnosis not present

## 2017-05-04 DIAGNOSIS — B20 Human immunodeficiency virus [HIV] disease: Secondary | ICD-10-CM

## 2017-05-04 DIAGNOSIS — R5383 Other fatigue: Secondary | ICD-10-CM | POA: Diagnosis not present

## 2017-05-04 DIAGNOSIS — Z23 Encounter for immunization: Secondary | ICD-10-CM

## 2017-05-04 DIAGNOSIS — M5126 Other intervertebral disc displacement, lumbar region: Secondary | ICD-10-CM

## 2017-05-04 DIAGNOSIS — Z0000 Encounter for general adult medical examination without abnormal findings: Secondary | ICD-10-CM

## 2017-05-04 DIAGNOSIS — R635 Abnormal weight gain: Secondary | ICD-10-CM

## 2017-05-04 DIAGNOSIS — Z1239 Encounter for other screening for malignant neoplasm of breast: Secondary | ICD-10-CM

## 2017-05-04 MED ORDER — CYCLOBENZAPRINE HCL 10 MG PO TABS: 10.0000 mg | ORAL_TABLET | Freq: Three times a day (TID) | ORAL | 1 refills | Status: DC | PRN

## 2017-05-04 NOTE — Progress Notes (Signed)
Patient ID: Paula Williams, female    DOB: Jun 16, 1970, 47 y.o.   MRN: [id number]  PCP: Paula Jun, FNP  Chief Complaint  Patient presents with  . Back Pain    chronic   . weight concerns    seems to keep gaining    Subjective:  HPI Paula Williams is a 47 y.o. female with HIV, chronic back pain,  presents for routine annual physical exam.   Health Maintenance  Paula Williams declines mammogram referral.  She is overdue for PAP and will schedule exam at later date. Agreed to both TDAP and Influenza vaccines. She reports no routine regular physical activity. She is a non-smoker. She suffers from HIV which has been well-controlled in the past. She is followed by Infectious Disease for HIV management. She is concern about weight gain and chronic fatigue. Reports a balanced diet which is low in simple carbohydrates with intake of 3 meals per day. Denies chest pain, shortness of breath, dizziness, weakness, or headaches.  Back Pain  Paula Williams complains of worsening chronic back pain over the course of 1 year. Last MRI in 2016 was significant for Chronic disc and endplate degeneration at L4-L5. Broad-based right foraminal disc protrusion could affect the exiting right L4nerve. Small left foraminal protrusion at L3-L4 in proximity to the left L3 nerve.Pain is increased with changing positions from sitting and standing. Denies any known direct injury to her back. She has not had formal evaluation and work-up since MRI as she had lost insurance. Previously trial with cyclobenzaprine which improved pain symptoms. Denies any symptoms concerning for caude equina.  Social History   Socioeconomic History  . Marital status: Single    Spouse name: Not on file  . Number of children: 1  . Years of education: Not on file  . Highest education level: Not on file  Social Needs  . Financial resource strain: Not on file  . Food insecurity - worry: Not on file  . Food insecurity - inability: Not on file  .  Transportation needs - medical: Not on file  . Transportation needs - non-medical: Not on file  Occupational History  . Not on file  Tobacco Use  . Smoking status: Never Smoker  . Smokeless tobacco: Never Used  Substance and Sexual Activity  . Alcohol use: No    Alcohol/week: 0.0 oz  . Drug use: No  . Sexual activity: Yes    Partners: Male    Birth control/protection: Condom    Comment: declined condoms  Other Topics Concern  . Not on file  Social History Narrative  . Not on file    Family History  Problem Relation Age of Onset  . Diabetes Mother   . Hypertension Father    Review of Systems  Pertinent negatives listed in HPI   Patient Active Problem List   Diagnosis Date Noted  . Healthcare maintenance 10/14/2016  . Rash and nonspecific skin eruption 06/12/2016  . Osteoarthritis of both knees 06/19/2015  . Right hip pain 01/02/2015  . History of dizziness 11/13/2014  . Prediabetes 11/13/2014  . Screening examination for venereal disease 07/25/2014  . Chronic back pain greater than 3 months duration 02/23/2014  . Decreased range of motion of intervertebral discs of lumbar spine 12/14/2013  . SOB (shortness of breath) 10/31/2013  . HIV disease ([place]) 08/19/2012  . Anxiety state 08/19/2012  . History of BCG vaccination 08/11/2012  . Fibroids 08/03/2012  . 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 Asthma, chronic 08/03/2012  . G6PD  deficiency ([place]) 08/03/2012  . 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 Intramural leiomyoma of uterus 08/03/2012  . Molluscum contagiosum 08/03/2012    Allergies  Allergen Reactions  . Tetracyclines & Related Swelling    Facial swelling  . Toradol [Ketorolac Tromethamine] Swelling       . Ampicillin Rash  . Clindamycin/Lincomycin Rash  . Sustiva [Efavirenz] Other (See Comments)    Intolerable nightmares  . Tramadol Swelling    Prior to Admission medications   Medication Sig Start Date End Date Taking? Authorizing Provider  albuterol  (PROVENTIL HFA;VENTOLIN HFA) 108 (90 BASE) MCG/ACT inhaler Inhale 2 puffs into the lungs every 6 (six) hours as needed for wheezing or shortness of breath. 08/23/14  Yes Micheline Chapman, NP  Ascorbic Acid (VITAMIN C PO) Take 1 tablet by mouth daily. Reported on 06/19/2015   Yes [provider]  beclomethasone (QVAR) 80 MCG/ACT inhaler Inhale 1 puff into the lungs 2 (two) times daily. Patient taking differently: Inhale 1 puff into the lungs 2 (two) times daily as needed (shortness of breath/wheezing).  11/21/13  Yes Dorena Dew, FNP  Cholecalciferol (VITAMIN D) 1000 UNITS capsule Take 1 capsule (1,000 Units total) by mouth daily. 08/10/12  Yes Comer, Okey Regal, MD  ipratropium (ATROVENT) 0.03 % nasal spray Place 2 sprays into both nostrils 2 (two) times daily. 03/18/17  Yes Paula Jun, FNP  VITAMIN A PO Take 1 capsule by mouth daily.    Yes [provider]  acetaminophen (TYLENOL) 325 MG tablet Take 2 tablets (650 mg total) by mouth every 6 (six) hours as needed for mild pain or moderate pain. Patient not taking: Reported on 03/18/2017 07/25/16 07/25/17  Paula Jun, FNP  albuterol (PROVENTIL HFA;VENTOLIN HFA) 108 (90 Base) MCG/ACT inhaler Inhale 2 puffs into the lungs every 4 (four) hours as needed for wheezing or shortness of breath (cough, shortness of breath or wheezing.). 03/18/17   Paula Jun, FNP  azithromycin (ZITHROMAX) 250 MG tablet Take 2 tabs PO x 1 dose, then 1 tab PO QD x 4 days 04/01/17   Paula Jun, FNP  benzonatate (TESSALON) 100 MG capsule Take 1-2 capsules (100-200 mg total) by mouth 3 (three) times daily as needed for cough. 04/01/17   Paula Jun, FNP  cetirizine (ZYRTEC) 10 MG tablet Take 1 tablet (10 mg total) by mouth daily. Patient not taking: Reported on 05/04/2017 03/18/17   Paula Jun, FNP  cyclobenzaprine (FLEXERIL) 10 MG tablet Take 1 tablet (10 mg total) by mouth 2 (two) times daily as needed for muscle  spasms. Patient not taking: Reported on 05/04/2017 08/14/15   Dorena Dew, FNP  diphenhydrAMINE (BENADRYL) 25 mg capsule Take 1 capsule (25 mg total) by mouth every 6 (six) hours as needed. Patient not taking: Reported on 03/18/2017 06/12/16   Thayer Headings, MD  elvitegravir-cobicistat-emtricitabine-tenofovir (GENVOYA) 150-150-200-10 MG TABS tablet TAKE 1 TABLET BY MOUTH DAILY. 10/14/16   Thayer Headings, MD  furosemide (LASIX) 20 MG tablet Take 1 tablet (20 mg total) by mouth daily as needed. Patient not taking: Reported on 03/18/2017 11/19/16   Paula Jun, FNP  guaiFENesin-codeine (GUAIFENESIN AC) 100-10 MG/5ML syrup Take 5 mLs by mouth every 8 (eight) hours as needed for cough. 03/18/17   Paula Jun, FNP  HYDROcodone-acetaminophen (NORCO/VICODIN) 5-325 MG tablet Take 1-2 tablets by mouth every 6 hours as needed for pain and/or cough. Patient not taking: Reported on 05/04/2017 09/27/16   Pisciotta, Elmyra Ricks, PA-C  ibuprofen (ADVIL,MOTRIN) 800 MG tablet Take 800 mg by mouth every 8 (eight) hours as needed (pain/menstrual cramps).    [provider]  potassium chloride (K-DUR) 10 MEQ tablet Take 1 tablet (10 mEq total) by mouth daily. Patient not taking: Reported on 05/04/2017 11/19/16   Paula Jun, FNP  predniSONE (DELTASONE) 20 MG tablet Take 2 tablets (40 mg total) by mouth daily with breakfast. Patient not taking: Reported on 05/04/2017 03/18/17   Paula Jun, FNP  selenium sulfide (SELSUN) 2.5 % shampoo APPLY EXTERNALLY TO THE AFFECTED AREA DAILY AS NEEDED FOR IRRITATION Patient not taking: Reported on 05/04/2017 10/14/16   Thayer Headings, MD    Past Medical, Surgical Family and Social History reviewed and updated.    Objective:   Today's Vitals   05/04/17 1108 05/04/17 1110  BP: 140/84   Pulse: 61 61  Temp: 98.2 F (36.8 C) 98.2 F (36.8 C)  TempSrc: Oral Oral  SpO2: 100% 100%  Weight: 230 lb (104.3 kg)   Height: 5\' 6"  (1.676 m)   PainSc: 6  6    PainLoc: Back     Wt Readings from Last 3 Encounters:  05/04/17 230 lb (104.3 kg)  03/18/17 232 lb (105.2 kg)  11/19/16 234 lb (106.1 kg)    Physical Exam Constitutional: Patient appears well-developed and well-nourished. No distress. HENT: Normocephalic, atraumatic, External right and left ear normal. Oropharynx is clear and moist.  Eyes: Conjunctivae and EOM are normal. PERRLA, no scleral icterus. Neck: Normal ROM. Neck supple. No JVD. No tracheal deviation. No thyromegaly. CVS: RRR, S1/S2 +, no murmurs, no gallops, no carotid bruit.  Pulmonary: Effort and breath sounds normal, no stridor, rhonchi, wheezes, rales.  Abdominal: Soft. BS +, no distension, tenderness, rebound or guarding.  Musculoskeletal: Normal range of motion. No edema and no tenderness.  Lymphadenopathy: No lymphadenopathy noted, cervical, inguinal or axillary Neuro: Alert. Normal reflexes, muscle tone coordination. No cranial nerve deficit. Skin: Skin is warm and dry. No rash noted. Not diaphoretic. No erythema. No pallor. Psychiatric: Flat affect. Behavior, judgment, thought content normal.   Assessment & Plan:  1. Prediabetes, Hemoglobin A1C. pending . Prior history of prediabetes.   2. HIV disease ([place]), history of good control. Recommended follow-up with infectious disease for viral load monitoring.  - CBC with Differential - Comprehensive metabolic panel  3. Weight gain, reviewed weights over the course of the last 12 months and patient has actually maintained a moslty consistent weight throughout the last 12 months. Patient is sedentary and reports a balanced diet. Will check Thyroid Panel With TSH and  Lipid panel today. Recommended exercise as tolerated, walking preferably compared to heavy lifting.  4. Other fatigue, VITAMIN D 25 Hydroxy (Vit-D Deficiency, Fractures)  5. Breast cancer screening, patient declined screening due to concern over screening process. Patient was educate and still declined  exam.  6. Need for immunization against influenza- Flu Vaccine QUAD 36+ mos IM  7. Bulging of lumbar intervertebral disc, per MRI completed in 2016, patient has chronic lower lumbar-sacral disease.  - Ambulatory referral to Orthopedic Surgery   Meds ordered this encounter  Medications  . cyclobenzaprine (FLEXERIL) 10 MG tablet    Sig: Take 1 tablet (10 mg total) by mouth 3 (three) times daily as needed for muscle spasms.    Dispense:  90 tablet    Refill:  1    Order Specific Question:   Supervising Provider    Answer:   Tresa Garter [id number]  Orders Placed This Encounter  Procedures  . Flu Vaccine QUAD 36+ mos IM  . Hemoglobin A1c  . CBC with Differential  . Comprehensive metabolic panel  . Thyroid Panel With TSH  . VITAMIN D 25 Hydroxy (Vit-D Deficiency, Fractures)  . Lipid panel    Order Specific Question:   Has the patient fasted?    Answer:   No  . Ambulatory referral to Orthopedic Surgery    Referral Priority:   Routine    Referral Type:   Surgical    Referral Reason:   Specialty Services Required    Requested Specialty:   Orthopedic Surgery    Number of Visits Requested:   1    RTC: PAP and TDAP vaccination, scheduled as needed.     Carroll Sage. Kenton Kingfisher, MSN, FNP-C [facility]  [address] [place], [place] [address] [phone]

## 2017-05-04 NOTE — Patient Instructions (Addendum)
I am referring you to Dr. Weston Anna orthopedics for chronic back pain  I am starting you on cyclobenzaprine for your chronic back pain please take as prescribed for back pain  Please use caution with heavy lifting Sciatica Sciatica is pain, numbness, weakness, or tingling along your sciatic nerve. The sciatic nerve starts in the lower back and goes down the back of each leg. Sciatica happens when this nerve is pinched or has pressure put on it. Sciatica usually goes away on its own or with treatment. Sometimes, sciatica may keep coming back (recur). Follow these instructions at home: Medicines  Take over-the-counter and prescription medicines only as told by your doctor.  Do not drive or use heavy machinery while taking prescription pain medicine. Managing pain  If directed, put ice on the affected area. ? Put ice in a plastic bag. ? Place a towel between your skin and the bag. ? Leave the ice on for 20 minutes, 2-3 times a day.  After icing, apply heat to the affected area before you exercise or as often as told by your doctor. Use the heat source that your doctor tells you to use, such as a moist heat pack or a heating pad. ? Place a towel between your skin and the heat source. ? Leave the heat on for 20-30 minutes. ? Remove the heat if your skin turns bright red. This is especially important if you are unable to feel pain, heat, or cold. You may have a greater risk of getting burned. Activity  Return to your normal activities as told by your doctor. Ask your doctor what activities are safe for you. ? Avoid activities that make your sciatica worse.  Take short rests during the day. Rest in a lying or standing position. This is usually better than sitting to rest. ? When you rest for a long time, do some physical activity or stretching between periods of rest. ? Avoid sitting for a long time without moving. Get up and move around at least one time each hour.  Exercise and  stretch regularly, as told by your doctor.  Do not lift anything that is heavier than 10 lb (4.5 kg) while you have symptoms of sciatica. ? Avoid lifting heavy things even when you do not have symptoms. ? Avoid lifting heavy things over and over.  When you lift objects, always lift in a way that is safe for your body. To do this, you should: ? Bend your knees. ? Keep the object close to your body. ? Avoid twisting. General instructions  Use good posture. ? Avoid leaning forward when you are sitting. ? Avoid hunching over when you are standing.  Stay at a healthy weight.  Wear comfortable shoes that support your feet. Avoid wearing high heels.  Avoid sleeping on a mattress that is too soft or too hard. You might have less pain if you sleep on a mattress that is firm enough to support your back.  Keep all follow-up visits as told by your doctor. This is important. Contact a doctor if:  You have pain that: ? Wakes you up when you are sleeping. ? Gets worse when you lie down. ? Is worse than the pain you have had in the past. ? Lasts longer than 4 weeks.  You lose weight for without trying. Get help right away if:  You cannot control when you pee (urinate) or poop (have a bowel movement).  You have weakness in any of these areas and it  gets worse. ? Lower back. ? Lower belly (pelvis). ? Butt (buttocks). ? Legs.  You have redness or swelling of your back.  You have a burning feeling when you pee. This information is not intended to replace advice given to you by your health care provider. Make sure you discuss any questions you have with your health care provider. Document Released: 11/13/2007 Document Revised: 07/12/2015 Document Reviewed: 10/13/2014 Elsevier Interactive Patient Education  [employer].

## 2017-05-05 LAB — COMPREHENSIVE METABOLIC PANEL
A/G RATIO: 1.2 (ref 1.2–2.2)
ALBUMIN: 4.2 g/dL (ref 3.5–5.5)
ALT: 20 IU/L (ref 0–32)
AST: 17 IU/L (ref 0–40)
Alkaline Phosphatase: 72 IU/L (ref 39–117)
BUN/Creatinine Ratio: 14 (ref 9–23)
BUN: 14 mg/dL (ref 6–24)
Bilirubin Total: 0.2 mg/dL (ref 0.0–1.2)
CALCIUM: 9.2 mg/dL (ref 8.7–10.2)
CO2: 25 mmol/L (ref 20–29)
Chloride: 102 mmol/L (ref 96–106)
Creatinine, Ser: 0.98 mg/dL (ref 0.57–1.00)
GFR calc Af Amer: 78 mL/min/{1.73_m2} (ref >59)
GFR, EST NON AFRICAN AMERICAN: 68 mL/min/{1.73_m2} (ref >59)
GLUCOSE: 93 mg/dL (ref 65–99)
Globulin, Total: 3.4 g/dL (ref 1.5–4.5)
Potassium: 4.5 mmol/L (ref 3.5–5.2)
Sodium: 141 mmol/L (ref 134–144)
Total Protein: 7.6 g/dL (ref 6.0–8.5)

## 2017-05-05 LAB — CBC WITH DIFFERENTIAL/PLATELET
BASOS: 0 %
Basophils Absolute: 0 10*3/uL (ref 0.0–0.2)
EOS (ABSOLUTE): 0.2 10*3/uL (ref 0.0–0.4)
Eos: 3 %
HEMOGLOBIN: 14.1 g/dL (ref 11.1–15.9)
Hematocrit: 42.4 % (ref 34.0–46.6)
IMMATURE GRANS (ABS): 0 10*3/uL (ref 0.0–0.1)
IMMATURE GRANULOCYTES: 0 %
LYMPHS: 49 %
Lymphocytes Absolute: 2.3 10*3/uL (ref 0.7–3.1)
MCH: 31.5 pg (ref 26.6–33.0)
MCHC: 33.3 g/dL (ref 31.5–35.7)
MCV: 95 fL (ref 79–97)
MONOCYTES: 9 %
Monocytes Absolute: 0.4 10*3/uL (ref 0.1–0.9)
NEUTROS PCT: 39 %
Neutrophils Absolute: 1.8 10*3/uL (ref 1.4–7.0)
PLATELETS: 225 10*3/uL (ref 150–379)
RBC: 4.48 x10E6/uL (ref 3.77–5.28)
RDW: 14.5 % (ref 12.3–15.4)
WBC: 4.8 10*3/uL (ref 3.4–10.8)

## 2017-05-05 LAB — HEMOGLOBIN A1C
Est. average glucose Bld gHb Est-mCnc: 111 mg/dL
Hgb A1c MFr Bld: 5.5 % (ref 4.8–5.6)

## 2017-05-05 LAB — THYROID PANEL WITH TSH
Free Thyroxine Index: 1.8 (ref 1.2–4.9)
T3 UPTAKE RATIO: 29 % (ref 24–39)
T4 TOTAL: 6.1 ug/dL (ref 4.5–12.0)
TSH: 0.367 u[IU]/mL — ABNORMAL LOW (ref 0.450–4.500)

## 2017-05-05 LAB — LIPID PANEL
CHOLESTEROL TOTAL: 188 mg/dL (ref 100–199)
Chol/HDL Ratio: 4.2 ratio (ref 0.0–4.4)
HDL: 45 mg/dL (ref >39)
LDL CALC: 114 mg/dL — AB (ref 0–99)
Triglycerides: 147 mg/dL (ref 0–149)
VLDL Cholesterol Cal: 29 mg/dL (ref 5–40)

## 2017-05-05 LAB — VITAMIN D 25 HYDROXY (VIT D DEFICIENCY, FRACTURES): VIT D 25 HYDROXY: 26.3 ng/mL — AB (ref 30.0–100.0)

## 2017-05-11 ENCOUNTER — Telehealth: Payer: Self-pay | Admitting: Family Medicine

## 2017-05-11 DIAGNOSIS — E0590 Thyrotoxicosis, unspecified without thyrotoxic crisis or storm: Secondary | ICD-10-CM

## 2017-05-11 MED ORDER — METHIMAZOLE 5 MG PO TABS: 5.0000 mg | ORAL_TABLET | Freq: Three times a day (TID) | ORAL | 1 refills | Status: DC

## 2017-05-11 NOTE — Telephone Encounter (Signed)
Contact patient to advise that her recent labs indicates that she has hyperthyroidism. For treatment of hyperthyroidism, I am staring her on low dose methimazole 5 mg, 3 times daily and have placed a referral for her to have an evaluation by endocrinology. Vitamin D is low. I am replacing vitamin D with 50, 000 units once weekly x 12 weeks. Cholesterol panel was significant for elevated LDL (bad cholesterol). At present no treatment indicated. Increase physical activity in efforts to lose weight and manage intake of foods high fats and simple sugars.   Carroll Sage. Kenton Kingfisher, MSN, FNP-C [facility]  [address] [place], [place] [address] [phone]

## 2017-05-11 NOTE — Telephone Encounter (Signed)
Spoke with patient.

## 2017-05-13 ENCOUNTER — Telehealth: Payer: Self-pay | Admitting: Family Medicine

## 2017-05-13 NOTE — Telephone Encounter (Signed)
Paula Williams,  Please contact patient to advise she has been accepted by endocrinology for evaluation of thyroid disorder, however they would like for her to discontinue taking the methimazole until she is seen and evaluated by endocrinology. If she has not been contacted already, endocrinology will be contacting her to schedule an appointment.   Carroll Sage. Kenton Kingfisher, MSN, FNP-C [facility]  [address] [place], [place] [address] [phone]

## 2017-05-13 NOTE — Telephone Encounter (Signed)
Patient notified

## 2017-07-03 ENCOUNTER — Telehealth: Payer: Self-pay | Admitting: *Deleted

## 2017-07-03 NOTE — Telephone Encounter (Signed)
PA approved for Genvoya via [email or web address] [facility] notified. Landis Gandy, RN

## 2017-08-07 ENCOUNTER — Ambulatory Visit (INDEPENDENT_AMBULATORY_CARE_PROVIDER_SITE_OTHER): Payer: Managed Care, Other (non HMO) | Admitting: Family Medicine

## 2017-08-07 ENCOUNTER — Encounter: Payer: Self-pay | Admitting: Family Medicine

## 2017-08-07 VITALS — BP 136/84 | HR 80 | Temp 97.4°F | Ht 66.0 in | Wt 235.0 lb

## 2017-08-07 DIAGNOSIS — R600 Localized edema: Secondary | ICD-10-CM

## 2017-08-07 DIAGNOSIS — B20 Human immunodeficiency virus [HIV] disease: Secondary | ICD-10-CM | POA: Diagnosis not present

## 2017-08-07 DIAGNOSIS — Z09 Encounter for follow-up examination after completed treatment for conditions other than malignant neoplasm: Secondary | ICD-10-CM

## 2017-08-07 DIAGNOSIS — Z23 Encounter for immunization: Secondary | ICD-10-CM

## 2017-08-07 DIAGNOSIS — R609 Edema, unspecified: Secondary | ICD-10-CM

## 2017-08-07 DIAGNOSIS — J0100 Acute maxillary sinusitis, unspecified: Secondary | ICD-10-CM | POA: Diagnosis not present

## 2017-08-07 MED ORDER — AMOXICILLIN-POT CLAVULANATE 875-125 MG PO TABS: 1.0000 | ORAL_TABLET | Freq: Two times a day (BID) | ORAL | 0 refills | Status: DC
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
## 2017-08-12 ENCOUNTER — Encounter: Payer: Self-pay | Admitting: Endocrinology

## 2017-08-12 ENCOUNTER — Ambulatory Visit (INDEPENDENT_AMBULATORY_CARE_PROVIDER_SITE_OTHER): Payer: Managed Care, Other (non HMO) | Admitting: Endocrinology

## 2017-08-12 DIAGNOSIS — E0590 Thyrotoxicosis, unspecified without thyrotoxic crisis or storm: Secondary | ICD-10-CM

## 2017-08-12 LAB — T4, FREE: Free T4: 0.68 ng/dL (ref 0.60–1.60)

## 2017-08-12 LAB — TSH: TSH: 1.34 u[IU]/mL (ref 0.35–4.50)
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
## 2017-08-12 NOTE — Patient Instructions (Addendum)
blood tests are requested for you today.  We'll let you know about the results. If ever you have fever while taking methimazole, stop it and call us, even if the reason is obvious, because of the risk of a rare side-effect. Please consider the treatment options we discussed, and let me know.  If you choose the radioactive iodine pill, it works like this: We would first check a thyroid "scan" (a special, but easy and painless type of thyroid x ray).  you go to the x-ray department of the hospital to swallow a pill, which contains a miniscule amount of radiation.  You will not notice any symptoms from this.  You will go back to the x-ray department the next day, to lie down in front of a camera.  The results of this will be sent to me.   Based on the results, i hope to order for you a treatment pill of radioactive iodine.  Although it is a larger amount of radiation, you will again notice no symptoms from this.  The pill is gone from your body in a few days (during which you should stay away from other people), but takes several months to work.  Therefore, please return here approximately 6-8 weeks after the treatment.  This treatment has been available for many years, and the only known side-effect is an underactive thyroid.  It is possible that i would eventually prescribe for you a thyroid hormone pill, which is very inexpensive.  You don't have to worry about side-effects of this thyroid hormone pill, because it is the same molecule your thyroid makes.

## 2017-08-17 ENCOUNTER — Telehealth: Payer: Self-pay

## 2017-08-17 NOTE — Telephone Encounter (Signed)
Patient states that she develop a rash when taking the Augmentin and stop taking it. Please advise

## 2017-08-18 ENCOUNTER — Other Ambulatory Visit: Payer: Self-pay | Admitting: Family Medicine

## 2017-08-18 DIAGNOSIS — J0100 Acute maxillary sinusitis, unspecified: Secondary | ICD-10-CM

## 2017-08-18 MED ORDER — CEFUROXIME AXETIL 500 MG PO TABS: 500.0000 mg | ORAL_TABLET | Freq: Two times a day (BID) | ORAL | 0 refills | Status: AC

## 2017-08-18 NOTE — Telephone Encounter (Signed)
Patient notified

## 2017-08-18 NOTE — Progress Notes (Signed)
Patient discontinued Augmentin, because she developed 'rash.'  After consultation with pharmacist, Rx for Ceftin 500 mg sent to pharmacy today.

## 2017-08-18 NOTE — Telephone Encounter (Signed)
-----   Message from Paula Williams, [place] sent at 08/18/2017  8:55 AM EDT ----- Regarding: "Antibiotic" Morey Hummingbird,   Please contact patient and let her know that we sent a substitute Antibiotic. It was sent to her pharmacy. Ceftin 500 mg BID X 10 days. She needs to report to office if she has any problems.   Thank you!

## 2017-11-02 ENCOUNTER — Other Ambulatory Visit: Payer: Self-pay | Admitting: Internal Medicine

## 2017-11-02 ENCOUNTER — Telehealth: Payer: Self-pay

## 2017-11-02 NOTE — Telephone Encounter (Signed)
Called patient after after receiving refill request from pharmacy. Patient has not been in clinic since 09/2016, was never scheduled for a 6 month follow-up. Patient was able to schedule an appointment for labs and office visit two weeks after. Patient will come on 11/03/17 for labs and office visit 11/23/17. Patient denies missing any days of medication since last visit. Cecilia

## 2017-11-03 ENCOUNTER — Other Ambulatory Visit: Payer: Managed Care, Other (non HMO)

## 2017-11-03 ENCOUNTER — Other Ambulatory Visit: Payer: Self-pay | Admitting: *Deleted

## 2017-11-03 DIAGNOSIS — Z113 Encounter for screening for infections with a predominantly sexual mode of transmission: Secondary | ICD-10-CM

## 2017-11-03 DIAGNOSIS — B20 Human immunodeficiency virus [HIV] disease: Secondary | ICD-10-CM

## 2017-11-03 NOTE — Telephone Encounter (Signed)
Ok to refill 

## 2017-11-04 LAB — T-HELPER CELL (CD4) - (RCID CLINIC ONLY)
CD4 % Helper T Cell: 29 % — ABNORMAL LOW (ref 33–55)
CD4 T CELL ABS: 620 /uL (ref 400–2700)

## 2017-11-04 LAB — URINE CYTOLOGY ANCILLARY ONLY
Chlamydia: NEGATIVE
Neisseria Gonorrhea: NEGATIVE

## 2017-11-05 LAB — CBC WITH DIFFERENTIAL/PLATELET
BASOS ABS: 31 {cells}/uL (ref 0–200)
Basophils Relative: 0.7 %
EOS ABS: 189 {cells}/uL (ref 15–500)
Eosinophils Relative: 4.3 %
HEMATOCRIT: 39.4 % (ref 35.0–45.0)
Hemoglobin: 13.5 g/dL (ref 11.7–15.5)
Lymphs Abs: 2094 cells/uL (ref 850–3900)
MCH: 31 pg (ref 27.0–33.0)
MCHC: 34.3 g/dL (ref 32.0–36.0)
MCV: 90.6 fL (ref 80.0–100.0)
MPV: 11.3 fL (ref 7.5–12.5)
Monocytes Relative: 10.2 %
NEUTROS PCT: 37.2 %
Neutro Abs: 1637 cells/uL (ref 1500–7800)
PLATELETS: 207 10*3/uL (ref 140–400)
RBC: 4.35 10*6/uL (ref 3.80–5.10)
RDW: 12.7 % (ref 11.0–15.0)
TOTAL LYMPHOCYTE: 47.6 %
WBC mixed population: 449 cells/uL (ref 200–950)
WBC: 4.4 10*3/uL (ref 3.8–10.8)

## 2017-11-05 LAB — COMPLETE METABOLIC PANEL WITH GFR
AG Ratio: 1.2 (calc) (ref 1.0–2.5)
ALBUMIN MSPROF: 4.1 g/dL (ref 3.6–5.1)
ALKALINE PHOSPHATASE (APISO): 73 U/L (ref 33–115)
ALT: 15 U/L (ref 6–29)
AST: 14 U/L (ref 10–35)
BUN: 13 mg/dL (ref 7–25)
CO2: 27 mmol/L (ref 20–32)
CREATININE: 0.97 mg/dL (ref 0.50–1.10)
Calcium: 9.4 mg/dL (ref 8.6–10.2)
Chloride: 104 mmol/L (ref 98–110)
GFR, EST AFRICAN AMERICAN: 79 mL/min/{1.73_m2} (ref >=60)
GFR, EST NON AFRICAN AMERICAN: 69 mL/min/{1.73_m2} (ref >=60)
GLOBULIN: 3.3 g/dL (ref 1.9–3.7)
GLUCOSE: 94 mg/dL (ref 65–99)
Potassium: 4.6 mmol/L (ref 3.5–5.3)
SODIUM: 140 mmol/L (ref 135–146)
TOTAL PROTEIN: 7.4 g/dL (ref 6.1–8.1)
Total Bilirubin: 0.4 mg/dL (ref 0.2–1.2)

## 2017-11-05 LAB — RPR: RPR: NONREACTIVE

## 2017-11-05 LAB — HIV-1 RNA QUANT-NO REFLEX-BLD
HIV 1 RNA QUANT: NOT DETECTED {copies}/mL
HIV-1 RNA QUANT, LOG: NOT DETECTED {Log_copies}/mL

## 2017-11-23 ENCOUNTER — Ambulatory Visit: Payer: Managed Care, Other (non HMO) | Admitting: Internal Medicine

## 2017-11-25 ENCOUNTER — Ambulatory Visit: Payer: Managed Care, Other (non HMO) | Admitting: Internal Medicine

## 2017-12-02 ENCOUNTER — Other Ambulatory Visit: Payer: Self-pay | Admitting: Internal Medicine

## 2017-12-30 ENCOUNTER — Other Ambulatory Visit: Payer: Self-pay | Admitting: Internal Medicine

## 2018-01-01 ENCOUNTER — Other Ambulatory Visit: Payer: Self-pay | Admitting: Internal Medicine

## 2018-03-02 ENCOUNTER — Ambulatory Visit: Payer: Managed Care, Other (non HMO) | Admitting: Internal Medicine

## 2018-03-11 ENCOUNTER — Ambulatory Visit (INDEPENDENT_AMBULATORY_CARE_PROVIDER_SITE_OTHER): Payer: Managed Care, Other (non HMO) | Admitting: Pharmacist

## 2018-03-11 DIAGNOSIS — B20 Human immunodeficiency virus [HIV] disease: Secondary | ICD-10-CM

## 2018-03-11 MED ORDER — ELVITEG-COBIC-EMTRICIT-TENOFAF 150-150-200-10 MG PO TABS: 1.0000 | ORAL_TABLET | Freq: Every day | ORAL | 1 refills | Status: DC
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
## 2018-03-12 LAB — T-HELPER CELL (CD4) - (RCID CLINIC ONLY)
CD4 % Helper T Cell: 31 % — ABNORMAL LOW (ref 33–55)
CD4 T Cell Abs: 930 /uL (ref 400–2700)

## 2018-03-18 LAB — COMPREHENSIVE METABOLIC PANEL
AG Ratio: 1.2 (calc) (ref 1.0–2.5)
ALKALINE PHOSPHATASE (APISO): 77 U/L (ref 33–115)
ALT: 16 U/L (ref 6–29)
AST: 17 U/L (ref 10–35)
Albumin: 4 g/dL (ref 3.6–5.1)
BUN: 13 mg/dL (ref 7–25)
CHLORIDE: 103 mmol/L (ref 98–110)
CO2: 27 mmol/L (ref 20–32)
CREATININE: 0.93 mg/dL (ref 0.50–1.10)
Calcium: 9.3 mg/dL (ref 8.6–10.2)
Globulin: 3.3 g/dL (calc) (ref 1.9–3.7)
Glucose, Bld: 116 mg/dL — ABNORMAL HIGH (ref 65–99)
Potassium: 4.1 mmol/L (ref 3.5–5.3)
Sodium: 139 mmol/L (ref 135–146)
Total Bilirubin: 0.3 mg/dL (ref 0.2–1.2)
Total Protein: 7.3 g/dL (ref 6.1–8.1)

## 2018-03-18 LAB — CBC
HCT: 39.6 % (ref 35.0–45.0)
Hemoglobin: 13.9 g/dL (ref 11.7–15.5)
MCH: 31.6 pg (ref 27.0–33.0)
MCHC: 35.1 g/dL (ref 32.0–36.0)
MCV: 90 fL (ref 80.0–100.0)
MPV: 11.5 fL (ref 7.5–12.5)
PLATELETS: 214 10*3/uL (ref 140–400)
RBC: 4.4 10*6/uL (ref 3.80–5.10)
RDW: 12.6 % (ref 11.0–15.0)
WBC: 5 10*3/uL (ref 3.8–10.8)

## 2018-03-18 LAB — LIPID PANEL
CHOLESTEROL: 217 mg/dL — AB (ref <200)
HDL: 45 mg/dL — AB (ref >50)
LDL Cholesterol (Calc): 136 mg/dL (calc) — ABNORMAL HIGH
Non-HDL Cholesterol (Calc): 172 mg/dL (calc) — ABNORMAL HIGH (ref <130)
Total CHOL/HDL Ratio: 4.8 (calc) (ref <5.0)
Triglycerides: 215 mg/dL — ABNORMAL HIGH (ref <150)

## 2018-03-18 LAB — HIV RNA, RTPCR W/R GT (RTI, PI,INT)
HIV 1 RNA Quant: <20 copies/mL
HIV-1 RNA Quant, Log: <1.3 Log copies/mL

## 2018-03-18 LAB — RPR: RPR Ser Ql: NONREACTIVE

## 2018-04-26 IMAGING — DX DG CHEST 2V
2 series · 2 of 2 positions shown · non-contrast
Comparison: Chest x-ray and chest CT scan July 27, 2013.

CLINICAL DATA: Positive tuberculin skin test.

EXAM:
CHEST  2 VIEW

[chest pa]
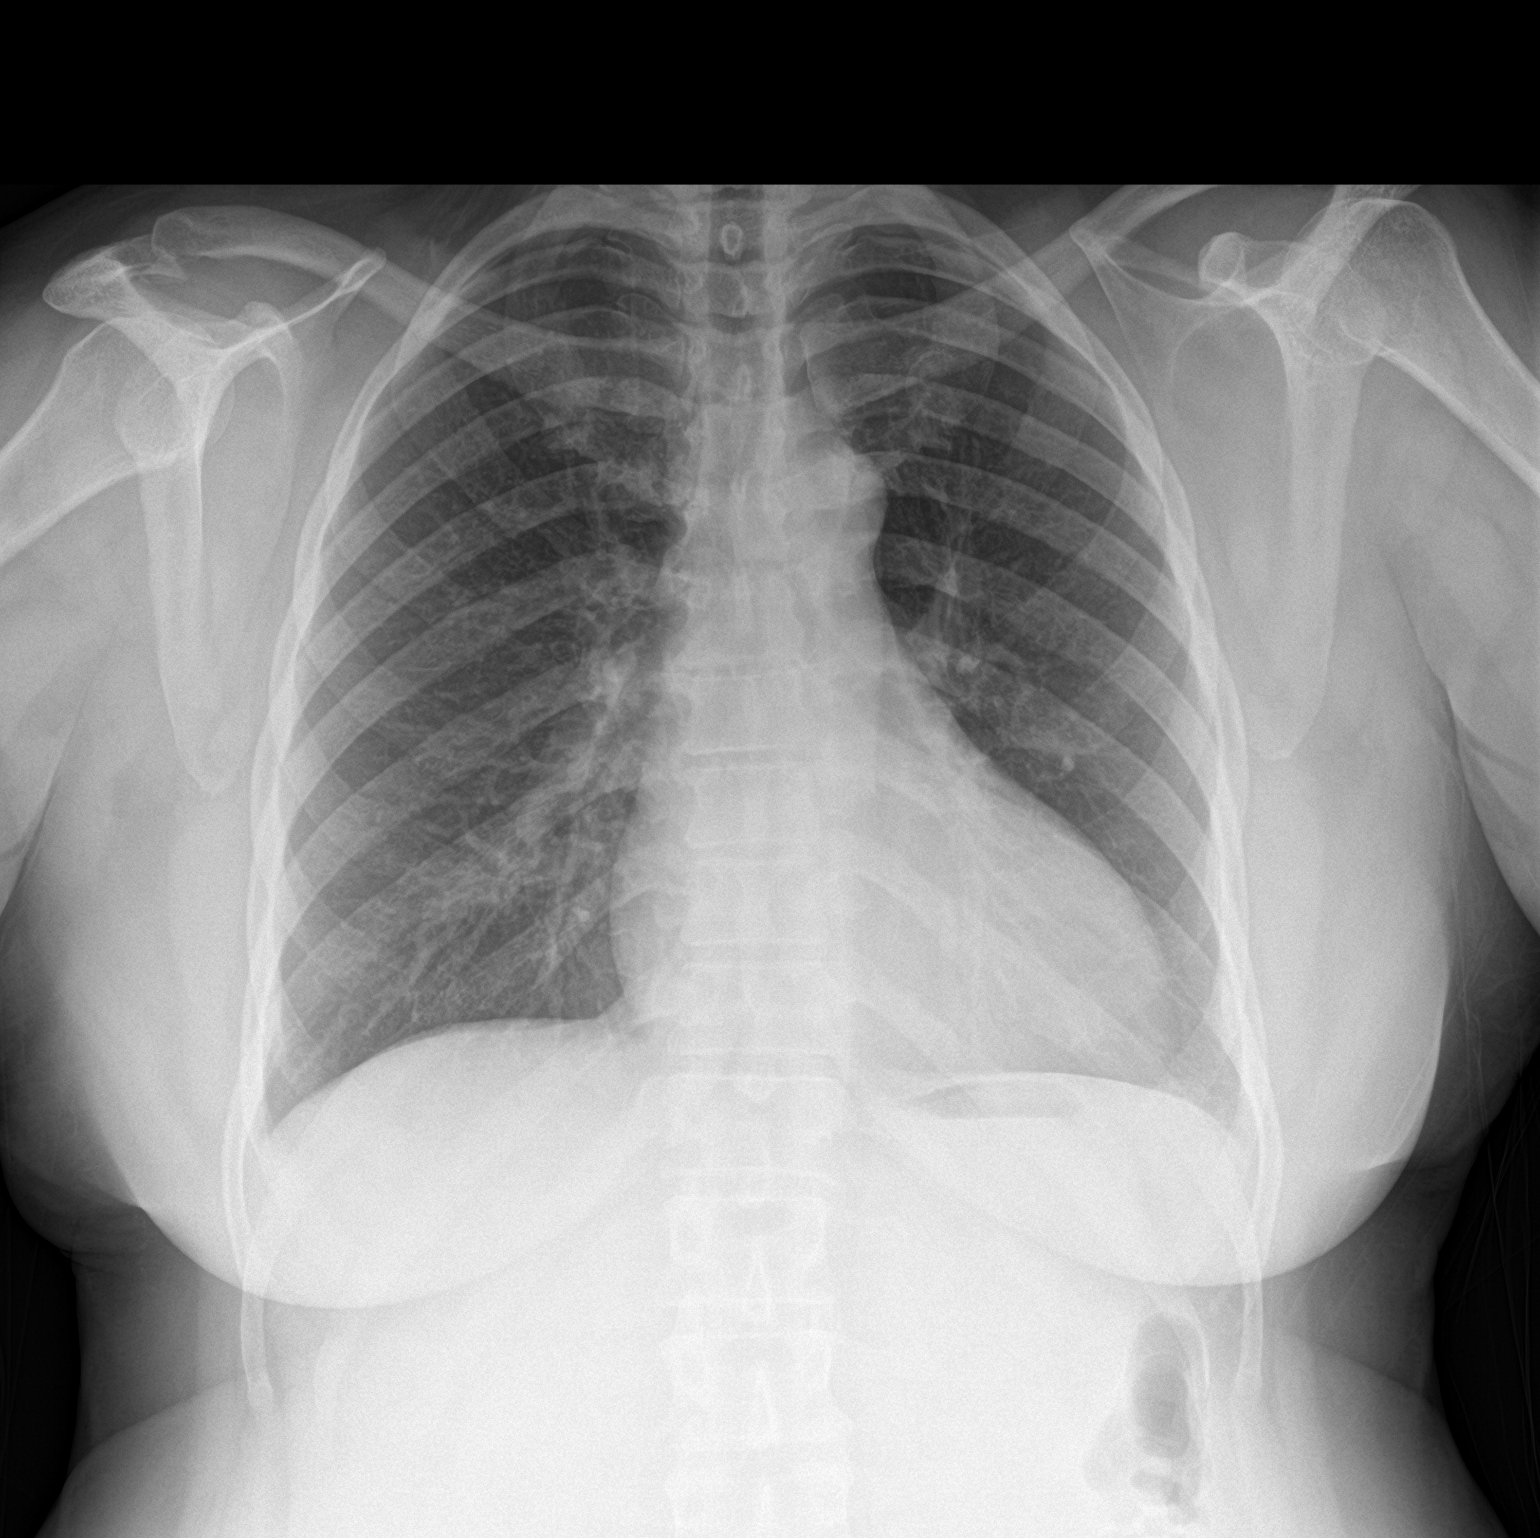

[chest lat]
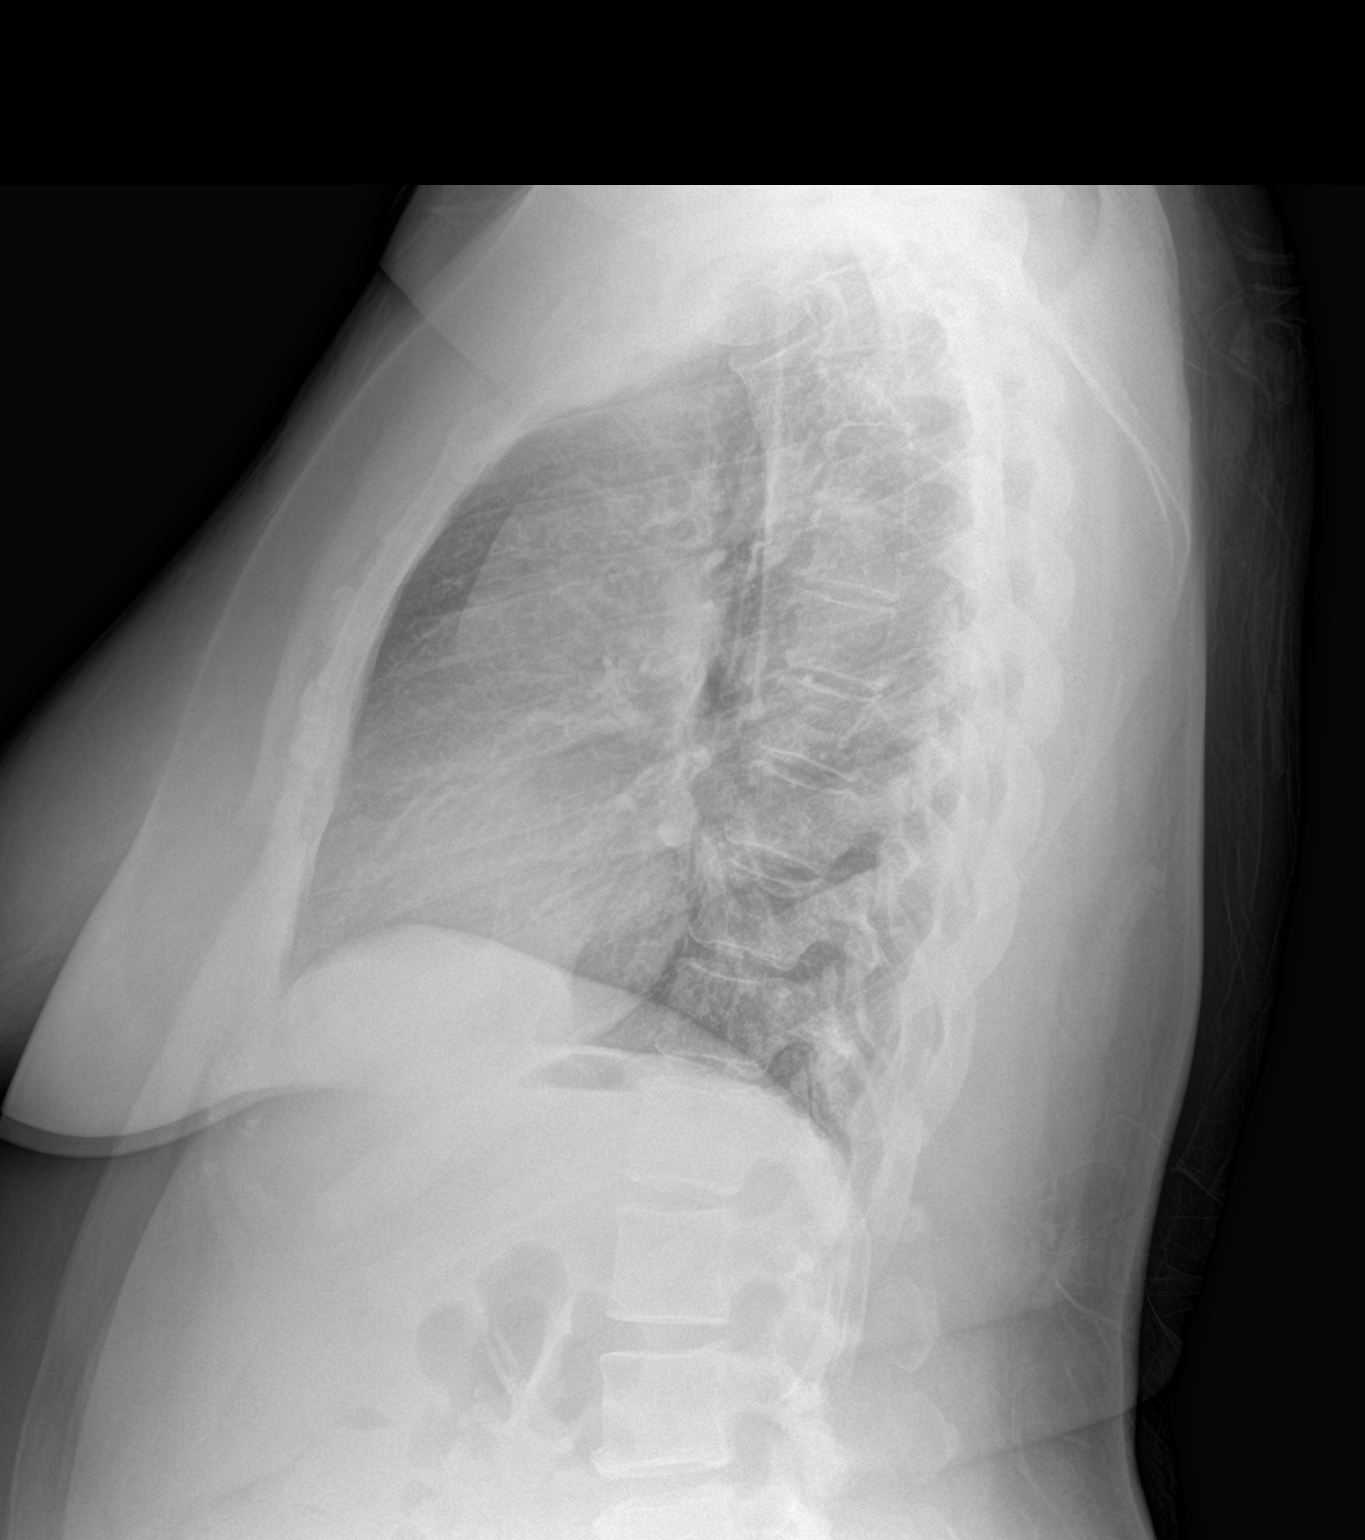

[2 of 2 positions shown; findings below may reference images not displayed]

FINDINGS: The lungs are adequately inflated. There is no focal infiltrate.
There is no pleural effusion. The heart is normal in size. The
pulmonary vascularity is not engorged. The mediastinum is normal in
width. The trachea is midline. There is no pleural effusion. The
bony thorax exhibits no acute abnormality.
IMPRESSION: There is no active cardiopulmonary disease. There is no evidence of
acute or old tuberculous infection.

## 2018-05-10 ENCOUNTER — Other Ambulatory Visit: Payer: Self-pay | Admitting: Internal Medicine

## 2018-05-10 DIAGNOSIS — B20 Human immunodeficiency virus [HIV] disease: Secondary | ICD-10-CM

## 2018-05-11 ENCOUNTER — Ambulatory Visit: Payer: Managed Care, Other (non HMO) | Admitting: Family

## 2018-06-10 ENCOUNTER — Other Ambulatory Visit: Payer: Self-pay | Admitting: Internal Medicine

## 2018-06-10 ENCOUNTER — Other Ambulatory Visit: Payer: Self-pay

## 2018-06-10 DIAGNOSIS — B20 Human immunodeficiency virus [HIV] disease: Secondary | ICD-10-CM

## 2018-06-10 MED ORDER — ELVITEG-COBIC-EMTRICIT-TENOFAF 150-150-200-10 MG PO TABS: 1.0000 | ORAL_TABLET | Freq: Every day | ORAL | 0 refills | Status: DC

## 2018-07-13 ENCOUNTER — Other Ambulatory Visit: Payer: Self-pay | Admitting: Internal Medicine

## 2018-07-13 DIAGNOSIS — B20 Human immunodeficiency virus [HIV] disease: Secondary | ICD-10-CM

## 2018-08-17 ENCOUNTER — Other Ambulatory Visit: Payer: Self-pay | Admitting: Internal Medicine

## 2018-08-17 DIAGNOSIS — B20 Human immunodeficiency virus [HIV] disease: Secondary | ICD-10-CM

## 2018-08-26 ENCOUNTER — Other Ambulatory Visit: Payer: Self-pay

## 2018-08-26 DIAGNOSIS — B20 Human immunodeficiency virus [HIV] disease: Secondary | ICD-10-CM

## 2018-08-26 MED ORDER — GENVOYA 150-150-200-10 MG PO TABS: 1.0000 | ORAL_TABLET | Freq: Every day | ORAL | 0 refills | Status: DC

## 2018-08-31 ENCOUNTER — Ambulatory Visit (INDEPENDENT_AMBULATORY_CARE_PROVIDER_SITE_OTHER): Payer: Managed Care, Other (non HMO) | Admitting: Internal Medicine

## 2018-08-31 ENCOUNTER — Encounter: Payer: Self-pay | Admitting: Internal Medicine

## 2018-08-31 ENCOUNTER — Ambulatory Visit: Payer: Managed Care, Other (non HMO)

## 2018-08-31 ENCOUNTER — Other Ambulatory Visit: Payer: Self-pay

## 2018-08-31 VITALS — BP 150/91 | HR 80 | Temp 98.1°F | Wt 237.0 lb

## 2018-08-31 DIAGNOSIS — B081 Molluscum contagiosum: Secondary | ICD-10-CM

## 2018-08-31 DIAGNOSIS — B20 Human immunodeficiency virus [HIV] disease: Secondary | ICD-10-CM | POA: Diagnosis not present

## 2018-08-31 DIAGNOSIS — Z113 Encounter for screening for infections with a predominantly sexual mode of transmission: Secondary | ICD-10-CM | POA: Diagnosis not present

## 2018-08-31 DIAGNOSIS — Z23 Encounter for immunization: Secondary | ICD-10-CM

## 2018-08-31 NOTE — Assessment & Plan Note (Signed)
Seems to be doing well, will check her labs today and rtc 6 months unless concerns.

## 2018-08-31 NOTE — Assessment & Plan Note (Signed)
Has recently screened negative.

## 2018-08-31 NOTE — Assessment & Plan Note (Signed)
Prevnar today and Menveo #2

## 2018-08-31 NOTE — Progress Notes (Signed)
   Subjective:    Patient ID: Paula Williams, female    DOB: 07-05-1970, 48 y.o.   MRN: [id number]  HPI Here for follow up of HIV I have not seen her in 2 years.  She was seen by PharmD in January though.  She tells me she has remained on her Genvoya since that time.  No issues with side effects, no missed doses.  No new health issues.  No associated n/v/d. Has not had a PAP smear and hesitant to get one.  Sees her PCP as well.    Review of Systems  Gastrointestinal: Negative for diarrhea and nausea.  Skin: Negative for rash.  Hematological: Negative for adenopathy.       Objective:   Physical Exam Constitutional:      Appearance: Normal appearance.  Eyes:     General: No scleral icterus. Cardiovascular:     Rate and Rhythm: Normal rate and regular rhythm.     Heart sounds: No murmur.  Pulmonary:     Effort: Pulmonary effort is normal.     Breath sounds: Normal breath sounds.  Skin:    Findings: No rash.  Neurological:     Mental Status: She is alert.  Psychiatric:        Mood and Affect: Mood normal.   SH: no tobacco        Assessment & Plan:

## 2018-08-31 NOTE — Assessment & Plan Note (Signed)
Resolved

## 2018-09-01 LAB — T-HELPER CELL (CD4) - (RCID CLINIC ONLY)
CD4 % Helper T Cell: 27 % — ABNORMAL LOW (ref 33–65)
CD4 T Cell Abs: 610 /uL (ref 400–1790)

## 2018-09-10 LAB — HIV-1 RNA ULTRAQUANT REFLEX TO GENTYP+
HIV 1 RNA Quant: <20 copies/mL
HIV-1 RNA Quant, Log: <1.3 Log copies/mL

## 2018-09-17 ENCOUNTER — Ambulatory Visit (HOSPITAL_COMMUNITY): Payer: Managed Care, Other (non HMO)

## 2018-09-17 ENCOUNTER — Other Ambulatory Visit: Payer: Self-pay

## 2018-09-17 ENCOUNTER — Encounter (HOSPITAL_COMMUNITY): Payer: Self-pay | Admitting: Emergency Medicine

## 2018-09-17 ENCOUNTER — Ambulatory Visit (HOSPITAL_COMMUNITY)
Admission: EM | Admit: 2018-09-17 | Discharge: 2018-09-17 | Disposition: A | Discharge status: Home / Self Care | Payer: Managed Care, Other (non HMO) | Attending: Emergency Medicine | Admitting: Emergency Medicine

## 2018-09-17 ENCOUNTER — Ambulatory Visit (INDEPENDENT_AMBULATORY_CARE_PROVIDER_SITE_OTHER): Payer: Managed Care, Other (non HMO)

## 2018-09-17 DIAGNOSIS — M25562 Pain in left knee: Secondary | ICD-10-CM | POA: Diagnosis not present

## 2018-09-17 DIAGNOSIS — R030 Elevated blood-pressure reading, without diagnosis of hypertension: Secondary | ICD-10-CM

## 2018-09-17 MED ORDER — IBUPROFEN 600 MG PO TABS: 600.0000 mg | ORAL_TABLET | Freq: Four times a day (QID) | ORAL | 0 refills | Status: DC | PRN
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
## 2018-09-17 NOTE — Discharge Instructions (Addendum)
Your knee x-ray was normal today.  You can take Tylenol or ibuprofen as needed for your discomfort.  Wear the Ace wrap as needed for comfort.  Rest and elevate your knee.  You can use ice packs as directed.  Return here or go to the emergency department if you develop numbness, tingling, weakness in your leg or foot.  Your blood pressure was elevated today at 172/94.  These follow-up with your primary care provider to have this rechecked.  Go to the emergency department if you develop dizziness, chest pain, heart palpitations, shortness of breath, or other concerning symptoms.

## 2018-09-17 NOTE — ED Notes (Signed)
APP applied ace wrap prior to discharge

## 2018-09-17 NOTE — ED Triage Notes (Signed)
Pt left knee pain x 2 weeks; pt denies injury

## 2018-09-21 DIAGNOSIS — M25562 Pain in left knee: Secondary | ICD-10-CM | POA: Insufficient documentation

## 2018-09-24 ENCOUNTER — Other Ambulatory Visit: Payer: Self-pay | Admitting: Internal Medicine

## 2018-09-24 DIAGNOSIS — B20 Human immunodeficiency virus [HIV] disease: Secondary | ICD-10-CM

## 2018-10-26 ENCOUNTER — Telehealth: Payer: Self-pay | Admitting: Pharmacy Technician

## 2018-10-26 NOTE — Telephone Encounter (Signed)
[facility] Patient Advocate Encounter  Completed and sent [employer] Advancing Access application for [place] for this patient who is uninsured.    Patient is approved 10/26/2018 through 11/25/2018.  This is to bridge her until ADAP is approved.Marland Kitchen  BIN      [id number] PCN    [id number] GRP    [id number] ID        MR:[id number]   Inez Catalina E. Nadara Mustard [place] Patient [facility] Phone: [phone] Fax:  [phone]

## 2018-10-27 ENCOUNTER — Encounter: Payer: Self-pay | Admitting: Internal Medicine

## 2018-10-29 NOTE — Telephone Encounter (Signed)
[facility] Patient Advocate Encounter  Notification from [place] arrived today via fax stating the patient's approval into the program. Her enrollment period is 10/26/2018 to 10/26/2019. Billing information is the same as listed below.

## 2019-01-03 ENCOUNTER — Ambulatory Visit (INDEPENDENT_AMBULATORY_CARE_PROVIDER_SITE_OTHER): Payer: Self-pay | Admitting: Family Medicine

## 2019-01-03 ENCOUNTER — Encounter: Payer: Self-pay | Admitting: Family Medicine

## 2019-01-03 ENCOUNTER — Other Ambulatory Visit: Payer: Self-pay

## 2019-01-03 VITALS — BP 124/64 | HR 83 | Temp 98.0°F | Ht 66.0 in | Wt 244.0 lb

## 2019-01-03 DIAGNOSIS — R739 Hyperglycemia, unspecified: Secondary | ICD-10-CM

## 2019-01-03 DIAGNOSIS — R6884 Jaw pain: Secondary | ICD-10-CM

## 2019-01-03 DIAGNOSIS — Z09 Encounter for follow-up examination after completed treatment for conditions other than malignant neoplasm: Secondary | ICD-10-CM

## 2019-01-03 DIAGNOSIS — Z7689 Persons encountering health services in other specified circumstances: Secondary | ICD-10-CM

## 2019-01-03 DIAGNOSIS — E66812 Obesity, class 2: Secondary | ICD-10-CM

## 2019-01-03 DIAGNOSIS — F419 Anxiety disorder, unspecified: Secondary | ICD-10-CM

## 2019-01-03 DIAGNOSIS — Z0000 Encounter for general adult medical examination without abnormal findings: Secondary | ICD-10-CM

## 2019-01-03 DIAGNOSIS — N390 Urinary tract infection, site not specified: Secondary | ICD-10-CM

## 2019-01-03 DIAGNOSIS — R7303 Prediabetes: Secondary | ICD-10-CM

## 2019-01-03 DIAGNOSIS — B20 Human immunodeficiency virus [HIV] disease: Secondary | ICD-10-CM

## 2019-01-03 DIAGNOSIS — R8290 Unspecified abnormal findings in urine: Secondary | ICD-10-CM

## 2019-01-03 DIAGNOSIS — H65191 Other acute nonsuppurative otitis media, right ear: Secondary | ICD-10-CM

## 2019-01-03 DIAGNOSIS — Z6839 Body mass index (BMI) 39.0-39.9, adult: Secondary | ICD-10-CM

## 2019-01-03 LAB — POCT URINALYSIS DIPSTICK
Bilirubin, UA: NEGATIVE
Blood, UA: NEGATIVE
Glucose, UA: NEGATIVE
Ketones, UA: NEGATIVE
Nitrite, UA: NEGATIVE
Protein, UA: NEGATIVE
Spec Grav, UA: 1.025 (ref 1.010–1.025)
Urobilinogen, UA: 0.2 E.U./dL
pH, UA: 5.5 (ref 5.0–8.0)

## 2019-01-03 LAB — POCT GLYCOSYLATED HEMOGLOBIN (HGB A1C): Hemoglobin A1C: 5.5 % (ref 4.0–5.6)

## 2019-01-03 LAB — GLUCOSE, POCT (MANUAL RESULT ENTRY): POC Glucose: 172 mg/dl — AB (ref 70–99)

## 2019-01-03 MED ORDER — SULFAMETHOXAZOLE-TRIMETHOPRIM 800-160 MG PO TABS: 1.0000 | ORAL_TABLET | Freq: Two times a day (BID) | ORAL | 0 refills | Status: DC

## 2019-01-03 MED ORDER — CLARITHROMYCIN 500 MG PO TABS: 500.0000 mg | ORAL_TABLET | Freq: Two times a day (BID) | ORAL | 0 refills | Status: AC
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
## 2019-01-03 NOTE — Patient Instructions (Addendum)
Sulfamethoxazole; Trimethoprim, SMX-TMP oral suspension What is this medicine? SULFAMETHOXAZOLE; TRIMETHOPRIM or SMX-TMP (suhl fuh meth OK suh zohl; trye METH oh prim) is a combination of a sulfonamide antibiotic and a second antibiotic, trimethoprim. It is used to treat or prevent certain kinds of bacterial infections.It will not work for colds, flu, or other viral infections. This medicine may be used for other purposes; ask your health care provider or pharmacist if you have questions. COMMON BRAND NAME(S): Septra, Sulfatrim, Sulfatrim Pediatric, Sultrex Pediatric What should I tell my health care provider before I take this medicine? They need to know if you have any of these conditions:  anemia  asthma  being treated with anticonvulsants  if you frequently drink alcohol containing drinks  kidney disease  liver disease  low level of folic acid or [id number] dehydrogenase  poor nutrition or malabsorption  porphyria  severe allergies  thyroid disorder  an unusual or allergic reaction to sulfamethoxazole, trimethoprim, sulfa drugs, other medicines, foods, dyes, or preservatives  pregnant or trying to get pregnant  breast-feeding How should I use this medicine? Take this suspension by mouth. Follow the directions on the prescription label. Shake the bottle well before taking. Use a specially marked spoon or container to measure your medicine. Ask your pharmacist if you do not have one. Household spoons are not accurate. Take your doses at regular intervals. Do not take more medicine than directed. Talk to your pediatrician regarding the use of this medicine in children. Special care may be needed. While this drug may be prescribed for children as young as 109 months of age for selected conditions, precautions do apply. Overdosage: If you think you have taken too much of this medicine contact a poison control center or emergency room at once. NOTE: This medicine is only  for you. Do not share this medicine with others. What if I miss a dose? If you miss a dose, take it as soon as you can. If it is almost time for your next dose, take only that dose. Do not take double or extra doses. What may interact with this medicine? Do not take this medicine with any of the following medications  aminobenzoate potassium  dofetilide  metronidazole This medicine may also interact with the following medications  ACE inhibitors like benazepril, enalapril, lisinopril, and ramipril  birth control pills  cyclosporine  digoxin  diuretics  indomethacin  medicines for diabetes  methenamine  methotrexate  phenytoin  potassium supplements  pyrimethamine  sulfinpyrazone  tricyclic antidepressants  warfarin This list may not describe all possible interactions. Give your health care provider a list of all the medicines, herbs, non-prescription drugs, or dietary supplements you use. Also tell them if you smoke, drink alcohol, or use illegal drugs. Some items may interact with your medicine. What should I watch for while using this medicine? Tell your doctor or health care professional if your symptoms do not improve. Drink several glasses of water a day to reduce the risk of kidney problems. Do not treat diarrhea with over the counter products. Contact your doctor if you have diarrhea that lasts more than 2 days or if it is severe and watery. This medicine can make you more sensitive to the sun. Keep out of the sun. If you cannot avoid being in the sun, wear protective clothing and use a sunscreen. Do not use sun lamps or tanning beds/booths. What side effects may I notice from receiving this medicine? Side effects that you should report to your doctor or  health care professional as soon as possible:  allergic reactions like skin rash or hives, swelling of the face, lips, or tongue  breathing problems  fever or chills, sore throat  irregular heartbeat,  chest pain  joint or muscle pain  pain or difficulty passing urine  red pinpoint spots on skin  redness, blistering, peeling or loosening of the skin, including inside the mouth  unusual bleeding or bruising  unusual weakness or tiredness  yellowing of the eyes or skin Side effects that usually do not require medical attention (report to your doctor or health care professional if they continue or are bothersome):  diarrhea  dizziness  headache  loss of appetite  nausea, vomiting  nervousness This list may not describe all possible side effects. Call your doctor for medical advice about side effects. You may report side effects to FDA at 1-800-FDA-1088. Where should I keep my medicine? Keep out of the reach of children. Store at room temperature between 15 and 25 degrees C (59 and 77 degrees F). Protect from light and moisture. Throw away any unused medicine after the expiration date. NOTE: This sheet is a summary. It may not cover all possible information. If you have questions about this medicine, talk to your doctor, pharmacist, or health care provider.  2020 Elsevier/Gold Standard (2012-09-10 14:37:40) Urosepsis, Adult  Urosepsis is a type of sepsis. Sepsis is a severe bodily reaction to an infection. Urosepsis is caused by a bacterial infection that starts in the urinary tract and spreads to the blood. The urinary tract is the system where urine is made, stored, and passed out of the body and includes the kidneys, ureters, bladder, and urethra. This may also be called the urinary system. In severe cases, sepsis can lead to septic shock. Septic shock can weaken your heart and cause your blood pressure to drop. This can make the body's central nervous system and other vital organs stop working. Urosepsis is a medical emergency that requires immediate treatment in a hospital. What are the causes? Common causes of this condition include:  A urinary tract infection (UTI) that  spreads to your blood.  A urinary tract blockage due to kidney stones.  Swelling and inflammation of the prostate (prostatitis) or prostate infection, in males. What increases the risk? You are more likely to develop this condition if you:  Are female, especially if you are sexually active.  Are age 75 or older.  Have a long-term disease, such as kidney disease or diabetes.  Have a weak disease-fighting system (immune system).  Have a condition that lessens or changes urine flow, such as a kidney or bladder stone, prostate disease, or a tumor in the urinary tract.  Have had surgery in an area of the urinary tract.  Have a small, thin tube in your urethra that drains urine from your bladder for a period of time (indwelling urinary catheter).  Have lost feeling below the waist or are in a wheelchair. What are the signs or symptoms? Early symptoms of this condition are similar to symptoms of a severe UTI. Common symptoms of this condition include:  Pain in your side, back, or lower abdomen.  Fever and chills.  Nausea and vomiting.  Frequent need to pass urine.  Burning pain when passing urine.  Bloody or cloudy urine.  Bad-smelling urine.  Fatigue.  Trouble passing urine or not being able to pass urine at all. Once the infection has spread to the blood and a sepsis reaction starts, other symptoms may  include:  Chills with shaking.  Cold and clammy skin.  A fever of 101.857F (38.5C) or higher.  Low body temperature of 96.257F (36C) or lower.  Fast breathing.  Trouble breathing.  Fast heartbeat.  Severe pain in the abdomen.  Muscle aches.  Anxiety.  Problems staying awake.  Confusion.  Fainting. How is this diagnosed? This condition is diagnosed based on your symptoms, your medical history, and a physical exam. You may also have:  Urine tests or blood tests to check kidney function and to look for infection.  Imaging tests such as a CT scan or  ultrasound to check for blockages in the urinary system. How is this treated? This condition is a medical emergency that needs to be treated right away in the hospital. This condition may be treated with:  An IV so that you can quickly receive: ? Antibiotic medicines. ? Fluids. ? Medicines to support blood pressure.  Oxygen and breathing support, if needed.  Removing a urinary catheter if it is the source of the infection, if this applies.  Filtering your blood with a machine (dialysis). This process cleans your blood if your kidneys have failed.  Surgery to drain infected areas or restore urine flow. This is rare. Follow these instructions at home: Medicines  Take over-the-counter and prescription medicines only as told by your health care provider.  If you were prescribed an antibiotic medicine, take it as told by your health care provider. Do not stop using the antibiotic even if you start to feel better. General instructions   Drink enough fluid to keep your urine pale yellow.  Return to your normal activities as told by your health care provider. Ask your health care provider what activities are safe for you.  Keep all follow-up visits as told by your health care provider. This is important. Contact a health care provider if:  You have symptoms that get worse or do not get better with treatment.  You have new UTI symptoms. Get help right away if:  You have new or continued symptoms of sepsis after hospitalization, such as: ? A fever of 101.857F (38.5C) or higher. ? Low body temperature of 96.257F (36C) or lower. ? Chills. ? Severe pain. ? Difficulty breathing. ? Confusion. ? Sleepiness. ? Nausea and vomiting. These symptoms may represent a serious problem that is an emergency. Do not wait to see if the symptoms will go away. Get medical help right away. Call your local emergency services (911 in the [place] Do not drive yourself to the hospital. Summary  Urosepsis  is a type of sepsis. Sepsis is a severe bodily reaction to an infection.  Urosepsis is a medical emergency that requires immediate treatment in a hospital.  Possible causes of urosepsis include a urinary tract infection that spreads to your blood, blockage from kidney stones, and prostate swelling or infection in males.  This condition may be treated with an IV so that you can quickly receive antibiotic medicines, fluids, and medicines to support your blood pressure. Other treatments may be used as well.  Get help right away if you have new or continued symptoms of sepsis after hospitalization. This information is not intended to replace advice given to you by your health care provider. Make sure you discuss any questions you have with your health care provider. Document Released: 02/03/2005 Document Revised: 03/03/2018 Document Reviewed: 03/04/2018 Elsevier Patient Education  [place].  Otitis Media, Adult  Otitis media means that the middle ear is red and swollen (  inflamed) and full of fluid. The condition usually goes away on its own. Follow these instructions at home:  Take over-the-counter and prescription medicines only as told by your doctor.  If you were prescribed an antibiotic medicine, take it as told by your doctor. Do not stop taking the antibiotic even if you start to feel better.  Keep all follow-up visits as told by your doctor. This is important. Contact a doctor if:  You have bleeding from your nose.  There is a lump on your neck.  You are not getting better in 5 days.  You feel worse instead of better. Get help right away if:  You have pain that is not helped with medicine.  You have swelling, redness, or pain around your ear.  You get a stiff neck.  You cannot move part of your face (paralyzed).  You notice that the bone behind your ear hurts when you touch it.  You get a very bad headache. Summary  Otitis media means that the middle ear is  red, swollen, and full of fluid.  This condition usually goes away on its own. In some cases, treatment may be needed.  If you were prescribed an antibiotic medicine, take it as told by your doctor. This information is not intended to replace advice given to you by your health care provider. Make sure you discuss any questions you have with your health care provider. Document Released: 07/23/2007 Document Revised: 01/16/2017 Document Reviewed: 02/25/2016 Elsevier Patient Education  2020 [employer].

## 2019-01-04 DIAGNOSIS — R6884 Jaw pain: Secondary | ICD-10-CM | POA: Insufficient documentation

## 2019-01-04 DIAGNOSIS — E66812 Obesity, class 2: Secondary | ICD-10-CM | POA: Insufficient documentation

## 2019-01-04 DIAGNOSIS — Z6839 Body mass index (BMI) 39.0-39.9, adult: Secondary | ICD-10-CM | POA: Insufficient documentation

## 2019-01-04 DIAGNOSIS — H6691 Otitis media, unspecified, right ear: Secondary | ICD-10-CM | POA: Insufficient documentation

## 2019-01-04 DIAGNOSIS — R739 Hyperglycemia, unspecified: Secondary | ICD-10-CM | POA: Insufficient documentation

## 2019-01-04 LAB — CBC WITH DIFFERENTIAL/PLATELET
Basophils Absolute: 0 10*3/uL (ref 0.0–0.2)
Basos: 0 %
EOS (ABSOLUTE): 0.2 10*3/uL (ref 0.0–0.4)
Eos: 2 %
Hematocrit: 39.7 % (ref 34.0–46.6)
Hemoglobin: 13.4 g/dL (ref 11.1–15.9)
Immature Grans (Abs): 0 10*3/uL (ref 0.0–0.1)
Immature Granulocytes: 0 %
Lymphocytes Absolute: 4.9 10*3/uL — ABNORMAL HIGH (ref 0.7–3.1)
Lymphs: 55 %
MCH: 31.1 pg (ref 26.6–33.0)
MCHC: 33.8 g/dL (ref 31.5–35.7)
MCV: 92 fL (ref 79–97)
Monocytes Absolute: 0.5 10*3/uL (ref 0.1–0.9)
Monocytes: 6 %
Neutrophils Absolute: 3.3 10*3/uL (ref 1.4–7.0)
Neutrophils: 37 %
Platelets: 197 10*3/uL (ref 150–450)
RBC: 4.31 x10E6/uL (ref 3.77–5.28)
RDW: 13 % (ref 11.7–15.4)
WBC: 8.9 10*3/uL (ref 3.4–10.8)

## 2019-01-04 LAB — LIPID PANEL
Chol/HDL Ratio: 3.8 ratio (ref 0.0–4.4)
Cholesterol, Total: 180 mg/dL (ref 100–199)
HDL: 47 mg/dL (ref >39)
LDL Chol Calc (NIH): 101 mg/dL — ABNORMAL HIGH (ref 0–99)
Triglycerides: 188 mg/dL — ABNORMAL HIGH (ref 0–149)
VLDL Cholesterol Cal: 32 mg/dL (ref 5–40)

## 2019-01-04 LAB — TSH: TSH: 0.955 u[IU]/mL (ref 0.450–4.500)

## 2019-01-04 LAB — VITAMIN B12: Vitamin B-12: 407 pg/mL (ref 232–1245)

## 2019-01-04 LAB — VITAMIN D 25 HYDROXY (VIT D DEFICIENCY, FRACTURES): Vit D, 25-Hydroxy: 25.6 ng/mL — ABNORMAL LOW (ref 30.0–100.0)

## 2019-01-04 NOTE — Addendum Note (Signed)
Addended by: Azzie Glatter on: 01/04/2019 05:43 AM   Modules accepted: Level of Service

## 2019-01-05 ENCOUNTER — Telehealth: Payer: Self-pay | Admitting: Family Medicine

## 2019-01-05 LAB — URINE CULTURE

## 2019-01-05 NOTE — Telephone Encounter (Signed)
I had initial office appointment with patient last week, who arrives with c/o right jaw pain. She also had appointment schedule with oral surgeon after office visit with me on the same day. I contacted patient this morning. She states that she was evaluated by oral surgeon, placed on oral antibiotics, and referred to ENT. Today, patient reports that pain is improved. She is to keep follow up appointment with our office and contact us sooner if symptoms do not continue to improve or worsen. She verbalized understanding.   Kathe Becton,  MSN, FNP-BC [facility] [address] [place], [place] [address] [phone]- fax

## 2019-01-11 DIAGNOSIS — J019 Acute sinusitis, unspecified: Secondary | ICD-10-CM | POA: Insufficient documentation

## 2019-01-11 DIAGNOSIS — H9201 Otalgia, right ear: Secondary | ICD-10-CM | POA: Insufficient documentation

## 2019-02-07 ENCOUNTER — Ambulatory Visit: Payer: Self-pay | Admitting: Family Medicine

## 2019-03-04 ENCOUNTER — Ambulatory Visit: Payer: Self-pay | Admitting: Family Medicine

## 2019-04-05 ENCOUNTER — Other Ambulatory Visit: Payer: Self-pay

## 2019-04-05 DIAGNOSIS — B20 Human immunodeficiency virus [HIV] disease: Secondary | ICD-10-CM

## 2019-04-05 MED ORDER — GENVOYA 150-150-200-10 MG PO TABS: 1.0000 | ORAL_TABLET | Freq: Every day | ORAL | 0 refills | Status: DC

## 2019-04-05 NOTE — Telephone Encounter (Signed)
Received call from pharmacy requesting medication refill. Genvoya Refill approved with 0 refills.   LPN called patient to scheduled appointments (labs/provider/Financial). Call transfer to front dest for scheduling.  Paula Williams

## 2019-04-06 ENCOUNTER — Ambulatory Visit: Payer: Self-pay

## 2019-04-06 ENCOUNTER — Other Ambulatory Visit: Payer: Self-pay

## 2019-04-27 ENCOUNTER — Other Ambulatory Visit: Payer: Self-pay

## 2019-04-27 ENCOUNTER — Encounter: Payer: Self-pay | Admitting: Internal Medicine

## 2019-04-27 ENCOUNTER — Ambulatory Visit (INDEPENDENT_AMBULATORY_CARE_PROVIDER_SITE_OTHER): Payer: 59 | Admitting: Internal Medicine

## 2019-04-27 VITALS — BP 147/90 | HR 80 | Temp 98.2°F | Ht 66.0 in | Wt 240.0 lb

## 2019-04-27 DIAGNOSIS — R739 Hyperglycemia, unspecified: Secondary | ICD-10-CM | POA: Diagnosis not present

## 2019-04-27 DIAGNOSIS — Z113 Encounter for screening for infections with a predominantly sexual mode of transmission: Secondary | ICD-10-CM | POA: Diagnosis not present

## 2019-04-27 DIAGNOSIS — B20 Human immunodeficiency virus [HIV] disease: Secondary | ICD-10-CM | POA: Diagnosis not present

## 2019-04-27 DIAGNOSIS — G4700 Insomnia, unspecified: Secondary | ICD-10-CM | POA: Diagnosis not present

## 2019-04-27 DIAGNOSIS — M549 Dorsalgia, unspecified: Secondary | ICD-10-CM

## 2019-04-27 DIAGNOSIS — G8929 Other chronic pain: Secondary | ICD-10-CM

## 2019-04-27 NOTE — Assessment & Plan Note (Signed)
Doing well on her medicaiton. I do not think her medication is causing her to be fatigued.  Will continue and rtc 6 months.

## 2019-04-27 NOTE — Assessment & Plan Note (Signed)
Will check a Hgb A1c

## 2019-04-27 NOTE — Patient Instructions (Signed)
COVID-19 Vaccine Information can be found at: [email or web address] For questions related to vaccine distribution or appointments, please email [email or web address] or call [phone].    

## 2019-04-27 NOTE — Assessment & Plan Note (Signed)
Continued complaint of back pain.  Musculoskeletal by history.  I discussed exercising, walking.  She can further discuss with her PCP

## 2019-04-27 NOTE — Assessment & Plan Note (Signed)
Will screen today 

## 2019-04-27 NOTE — Progress Notes (Signed)
   Subjective:    Patient ID: Paula Williams, female    DOB: 09/05/1970, 49 y.o.   MRN: [id number]  HPI Here for follow up of HIV She continues on Genvoya and no missed doses.  No labs prior to the visit.  Has been well-cotnrolled.  Has had a lipid panel by her PCP.  No associated n/v/d.   She has multiple complaints including back pain associated with movement, fatigue and sleep issues.  She has not discussed these with her PCP.  She thinks they are related to her medication.    Review of Systems  Gastrointestinal: Negative for diarrhea and nausea.  Skin: Negative for rash.  Hematological: Negative for adenopathy.       Objective:   Physical Exam Constitutional:      Appearance: Normal appearance.  Eyes:     General: No scleral icterus. Cardiovascular:     Rate and Rhythm: Normal rate and regular rhythm.     Heart sounds: No murmur.  Pulmonary:     Effort: Pulmonary effort is normal.  Skin:    Findings: No rash.  Neurological:     Mental Status: She is alert.  Psychiatric:        Mood and Affect: Mood normal.   SH: no tobacco        Assessment & Plan:

## 2019-04-27 NOTE — Assessment & Plan Note (Signed)
She states she sleeps poorly but feels her fatigue and lack of concentration is related to something else.  Will refer her to a sleep center for evaluation.

## 2019-04-28 LAB — T-HELPER CELL (CD4) - (RCID CLINIC ONLY)
CD4 % Helper T Cell: 30 % — ABNORMAL LOW (ref 33–65)
CD4 T Cell Abs: 667 /uL (ref 400–1790)

## 2019-04-28 LAB — URINE CYTOLOGY ANCILLARY ONLY
Chlamydia: NEGATIVE
Comment: NEGATIVE
Comment: NORMAL
Neisseria Gonorrhea: NEGATIVE

## 2019-04-29 LAB — HIV-1 RNA QUANT-NO REFLEX-BLD
HIV 1 RNA Quant: <20 copies/mL — AB
HIV-1 RNA Quant, Log: <1.3 Log copies/mL — AB

## 2019-04-29 LAB — COMPLETE METABOLIC PANEL WITH GFR
AG Ratio: 1.4 (calc) (ref 1.0–2.5)
ALT: 14 U/L (ref 6–29)
AST: 16 U/L (ref 10–35)
Albumin: 4 g/dL (ref 3.6–5.1)
Alkaline phosphatase (APISO): 68 U/L (ref 37–153)
BUN: 18 mg/dL (ref 7–25)
CO2: 23 mmol/L (ref 20–32)
Calcium: 9.2 mg/dL (ref 8.6–10.4)
Chloride: 104 mmol/L (ref 98–110)
Creat: 0.84 mg/dL (ref 0.50–1.05)
GFR, Est African American: 93 mL/min/{1.73_m2} (ref >=60)
GFR, Est Non African American: 80 mL/min/{1.73_m2} (ref >=60)
Globulin: 2.9 g/dL (calc) (ref 1.9–3.7)
Glucose, Bld: 116 mg/dL — ABNORMAL HIGH (ref 65–99)
Potassium: 4.5 mmol/L (ref 3.5–5.3)
Sodium: 139 mmol/L (ref 135–146)
Total Bilirubin: 0.3 mg/dL (ref 0.2–1.2)
Total Protein: 6.9 g/dL (ref 6.1–8.1)

## 2019-04-29 LAB — RPR: RPR Ser Ql: NONREACTIVE

## 2019-04-29 LAB — HEMOGLOBIN A1C
Hgb A1c MFr Bld: 5.8 % of total Hgb — ABNORMAL HIGH (ref <5.7)
Mean Plasma Glucose: 120 (calc)
eAG (mmol/L): 6.6 (calc)

## 2019-05-02 ENCOUNTER — Other Ambulatory Visit: Payer: Self-pay | Admitting: *Deleted

## 2019-05-02 DIAGNOSIS — B20 Human immunodeficiency virus [HIV] disease: Secondary | ICD-10-CM

## 2019-05-02 MED ORDER — GENVOYA 150-150-200-10 MG PO TABS: 1.0000 | ORAL_TABLET | Freq: Every day | ORAL | 5 refills | Status: DC

## 2019-05-16 NOTE — Telephone Encounter (Signed)
No phone call on this patient.

## 2019-06-09 ENCOUNTER — Other Ambulatory Visit: Payer: Self-pay | Admitting: Family Medicine

## 2019-06-09 DIAGNOSIS — H65191 Other acute nonsuppurative otitis media, right ear: Secondary | ICD-10-CM

## 2019-06-20 ENCOUNTER — Telehealth: Payer: Self-pay | Admitting: Family Medicine

## 2019-06-21 NOTE — Telephone Encounter (Signed)
Forms left at front desk pick-up. Patient informed.

## 2019-06-22 ENCOUNTER — Telehealth: Payer: Self-pay

## 2019-06-22 NOTE — Telephone Encounter (Signed)
Patient called office today requesting copy of immunization record for school. Record was printed and left with the front desk staff. Patient will come to office to pick it up. [place]

## 2019-09-27 DIAGNOSIS — Z23 Encounter for immunization: Secondary | ICD-10-CM | POA: Diagnosis not present

## 2019-10-17 ENCOUNTER — Other Ambulatory Visit: Payer: 59

## 2019-10-17 ENCOUNTER — Other Ambulatory Visit: Payer: Self-pay

## 2019-10-17 DIAGNOSIS — Z79899 Other long term (current) drug therapy: Secondary | ICD-10-CM

## 2019-10-17 DIAGNOSIS — Z113 Encounter for screening for infections with a predominantly sexual mode of transmission: Secondary | ICD-10-CM

## 2019-10-17 DIAGNOSIS — B20 Human immunodeficiency virus [HIV] disease: Secondary | ICD-10-CM

## 2019-10-21 ENCOUNTER — Other Ambulatory Visit: Payer: BC Managed Care – PPO

## 2019-10-21 ENCOUNTER — Other Ambulatory Visit (HOSPITAL_COMMUNITY)
Admission: RE | Admit: 2019-10-21 | Discharge: 2019-10-21 | Disposition: A | Discharge status: Home / Self Care | Payer: BC Managed Care – PPO | Source: Ambulatory Visit | Attending: Internal Medicine | Admitting: Internal Medicine

## 2019-10-21 ENCOUNTER — Other Ambulatory Visit: Payer: Self-pay

## 2019-10-21 DIAGNOSIS — Z79899 Other long term (current) drug therapy: Secondary | ICD-10-CM | POA: Diagnosis not present

## 2019-10-21 DIAGNOSIS — Z113 Encounter for screening for infections with a predominantly sexual mode of transmission: Secondary | ICD-10-CM

## 2019-10-21 DIAGNOSIS — B20 Human immunodeficiency virus [HIV] disease: Secondary | ICD-10-CM

## 2019-10-21 LAB — T-HELPER CELL (CD4) - (RCID CLINIC ONLY)
CD4 % Helper T Cell: 31 % — ABNORMAL LOW (ref 33–65)
CD4 T Cell Abs: 621 /uL (ref 400–1790)

## 2019-10-21 NOTE — Addendum Note (Signed)
Addended by: Caffie Pinto on: 10/21/2019 11:00 AM   Modules accepted: Orders

## 2019-10-25 LAB — HIV-1 RNA QUANT-NO REFLEX-BLD
HIV 1 RNA Quant: <20 Copies/mL — ABNORMAL HIGH
HIV-1 RNA Quant, Log: <1.3 Log cps/mL — ABNORMAL HIGH

## 2019-10-25 LAB — CBC WITH DIFFERENTIAL/PLATELET
Absolute Monocytes: 422 cells/uL (ref 200–950)
Basophils Absolute: 19 cells/uL (ref 0–200)
Basophils Relative: 0.4 %
Eosinophils Absolute: 331 cells/uL (ref 15–500)
Eosinophils Relative: 6.9 %
HCT: 41 % (ref 35.0–45.0)
Hemoglobin: 14.1 g/dL (ref 11.7–15.5)
Lymphs Abs: 2170 cells/uL (ref 850–3900)
MCH: 31.3 pg (ref 27.0–33.0)
MCHC: 34.4 g/dL (ref 32.0–36.0)
MCV: 91.1 fL (ref 80.0–100.0)
MPV: 11.3 fL (ref 7.5–12.5)
Monocytes Relative: 8.8 %
Neutro Abs: 1858 cells/uL (ref 1500–7800)
Neutrophils Relative %: 38.7 %
Platelets: 218 10*3/uL (ref 140–400)
RBC: 4.5 10*6/uL (ref 3.80–5.10)
RDW: 13 % (ref 11.0–15.0)
Total Lymphocyte: 45.2 %
WBC: 4.8 10*3/uL (ref 3.8–10.8)

## 2019-10-25 LAB — LIPID PANEL
Cholesterol: 219 mg/dL — ABNORMAL HIGH (ref <200)
HDL: 44 mg/dL — ABNORMAL LOW (ref >=50)
LDL Cholesterol (Calc): 141 mg/dL (calc) — ABNORMAL HIGH
Non-HDL Cholesterol (Calc): 175 mg/dL (calc) — ABNORMAL HIGH (ref <130)
Total CHOL/HDL Ratio: 5 (calc) — ABNORMAL HIGH (ref <5.0)
Triglycerides: 199 mg/dL — ABNORMAL HIGH (ref <150)

## 2019-10-25 LAB — COMPREHENSIVE METABOLIC PANEL
AG Ratio: 1.4 (calc) (ref 1.0–2.5)
ALT: 18 U/L (ref 6–29)
AST: 18 U/L (ref 10–35)
Albumin: 4.2 g/dL (ref 3.6–5.1)
Alkaline phosphatase (APISO): 71 U/L (ref 37–153)
BUN: 10 mg/dL (ref 7–25)
CO2: 26 mmol/L (ref 20–32)
Calcium: 9.4 mg/dL (ref 8.6–10.4)
Chloride: 104 mmol/L (ref 98–110)
Creat: 0.97 mg/dL (ref 0.50–1.05)
Globulin: 3.1 g/dL (calc) (ref 1.9–3.7)
Glucose, Bld: 120 mg/dL — ABNORMAL HIGH (ref 65–99)
Potassium: 4.5 mmol/L (ref 3.5–5.3)
Sodium: 141 mmol/L (ref 135–146)
Total Bilirubin: 0.3 mg/dL (ref 0.2–1.2)
Total Protein: 7.3 g/dL (ref 6.1–8.1)

## 2019-10-25 LAB — URINE CYTOLOGY ANCILLARY ONLY
Chlamydia: NEGATIVE
Comment: NEGATIVE
Comment: NORMAL
Neisseria Gonorrhea: NEGATIVE

## 2019-10-25 LAB — RPR: RPR Ser Ql: NONREACTIVE

## 2019-10-31 ENCOUNTER — Encounter: Payer: 59 | Admitting: Internal Medicine

## 2019-11-02 ENCOUNTER — Other Ambulatory Visit: Payer: Self-pay | Admitting: Internal Medicine

## 2019-11-02 ENCOUNTER — Ambulatory Visit (INDEPENDENT_AMBULATORY_CARE_PROVIDER_SITE_OTHER): Payer: BC Managed Care – PPO | Admitting: Internal Medicine

## 2019-11-02 ENCOUNTER — Other Ambulatory Visit: Payer: Self-pay

## 2019-11-02 ENCOUNTER — Encounter: Payer: Self-pay | Admitting: Internal Medicine

## 2019-11-02 VITALS — BP 161/94 | HR 80 | Wt 244.0 lb

## 2019-11-02 DIAGNOSIS — B20 Human immunodeficiency virus [HIV] disease: Secondary | ICD-10-CM

## 2019-11-02 DIAGNOSIS — J4520 Mild intermittent asthma, uncomplicated: Secondary | ICD-10-CM | POA: Diagnosis not present

## 2019-11-02 DIAGNOSIS — J0110 Acute frontal sinusitis, unspecified: Secondary | ICD-10-CM | POA: Diagnosis not present

## 2019-11-02 DIAGNOSIS — G4700 Insomnia, unspecified: Secondary | ICD-10-CM

## 2019-11-02 DIAGNOSIS — Z23 Encounter for immunization: Secondary | ICD-10-CM | POA: Diagnosis not present

## 2019-11-02 MED ORDER — QVAR 80 MCG/ACT IN AERS: 1.0000 | INHALATION_SPRAY | Freq: Two times a day (BID) | RESPIRATORY_TRACT | 5 refills | Status: AC

## 2019-11-02 MED ORDER — CETIRIZINE HCL 10 MG PO TABS: 10.0000 mg | ORAL_TABLET | Freq: Every day | ORAL | 3 refills | Status: DC

## 2019-11-02 MED ORDER — GENVOYA 150-150-200-10 MG PO TABS: 1.0000 | ORAL_TABLET | Freq: Every day | ORAL | 11 refills | Status: DC

## 2019-11-02 NOTE — Assessment & Plan Note (Signed)
Will resend the referral. 

## 2019-11-02 NOTE — Assessment & Plan Note (Signed)
I discussed the indications for antibiotics for sinusitis and appropriate initial therapies including nasal steroids, analgesics and allergy medication.  She has had no fever.  I have refilled her nasal steroids and prescribed allergy medication.  She feels she will discuss antibiotics with her PCP since she does not feel it will be adequate treatment.

## 2019-11-02 NOTE — Progress Notes (Signed)
   Subjective:    Patient ID: Paula Williams, female    DOB: 1971-01-09, 49 y.o.   MRN: [id number]  HPI Here for follow up of HIV She continues on Genvoya and denies any missed doses.  CD4 of 621 and viral load < 20.  She complains of sinus issues and is typical for this time of year.  She is requesting an antibiotic for this.  She did not ever hear from the sleep clinic regarding an appointment.    Review of Systems  Gastrointestinal: Negative for diarrhea and nausea.  Skin: Negative for rash.       Objective:   Physical Exam Constitutional:      Appearance: Normal appearance.  Eyes:     General: No scleral icterus. Pulmonary:     Effort: Pulmonary effort is normal.  Skin:    Findings: No rash.  Neurological:     Mental Status: She is alert.  Psychiatric:        Mood and Affect: Mood normal.   SH: no tobacco        Assessment & Plan:

## 2019-11-02 NOTE — Assessment & Plan Note (Signed)
She continues on Genvoya and no issues.  Good labs and no other concerns.  Refills sent.  rtc 6 months.

## 2019-12-21 ENCOUNTER — Encounter (INDEPENDENT_AMBULATORY_CARE_PROVIDER_SITE_OTHER): Payer: Self-pay

## 2019-12-27 DIAGNOSIS — Z23 Encounter for immunization: Secondary | ICD-10-CM | POA: Diagnosis not present

## 2020-01-01 ENCOUNTER — Ambulatory Visit (HOSPITAL_COMMUNITY): Admit: 2020-01-01 | Discharge status: Against Medical Advice | Payer: BC Managed Care – PPO

## 2020-01-01 DIAGNOSIS — R112 Nausea with vomiting, unspecified: Secondary | ICD-10-CM | POA: Diagnosis not present

## 2020-01-01 DIAGNOSIS — R8289 Other abnormal findings on cytological and histological examination of urine: Secondary | ICD-10-CM | POA: Diagnosis not present

## 2020-01-05 ENCOUNTER — Encounter: Payer: Self-pay | Admitting: Neurology

## 2020-01-05 ENCOUNTER — Ambulatory Visit: Payer: BC Managed Care – PPO | Admitting: Neurology

## 2020-01-05 ENCOUNTER — Other Ambulatory Visit: Payer: Self-pay

## 2020-01-05 VITALS — BP 143/92 | HR 82 | Ht 66.0 in | Wt 235.0 lb

## 2020-01-05 DIAGNOSIS — Z227 Latent tuberculosis: Secondary | ICD-10-CM

## 2020-01-05 DIAGNOSIS — J4540 Moderate persistent asthma, uncomplicated: Secondary | ICD-10-CM

## 2020-01-05 DIAGNOSIS — G2581 Restless legs syndrome: Secondary | ICD-10-CM

## 2020-01-05 DIAGNOSIS — R0683 Snoring: Secondary | ICD-10-CM | POA: Diagnosis not present

## 2020-01-05 DIAGNOSIS — G4719 Other hypersomnia: Secondary | ICD-10-CM

## 2020-01-05 DIAGNOSIS — G4710 Hypersomnia, unspecified: Secondary | ICD-10-CM | POA: Diagnosis not present

## 2020-01-05 DIAGNOSIS — G4730 Sleep apnea, unspecified: Secondary | ICD-10-CM

## 2020-01-05 MED ORDER — MODAFINIL 200 MG PO TABS: 200.0000 mg | ORAL_TABLET | Freq: Every day | ORAL | 5 refills | Status: DC

## 2020-01-05 NOTE — Addendum Note (Signed)
Addended by: Larey Seat on: 01/05/2020 03:57 PM   Modules accepted: Orders

## 2020-01-05 NOTE — Patient Instructions (Signed)
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
## 2020-01-10 ENCOUNTER — Encounter: Payer: Self-pay | Admitting: Neurology

## 2020-01-16 ENCOUNTER — Other Ambulatory Visit: Payer: Self-pay | Admitting: Neurology

## 2020-01-16 MED ORDER — MODAFINIL 200 MG PO TABS: 200.0000 mg | ORAL_TABLET | Freq: Every day | ORAL | 5 refills | Status: DC

## 2020-01-25 ENCOUNTER — Ambulatory Visit (INDEPENDENT_AMBULATORY_CARE_PROVIDER_SITE_OTHER): Payer: BC Managed Care – PPO | Admitting: Neurology

## 2020-01-25 ENCOUNTER — Other Ambulatory Visit: Payer: Self-pay

## 2020-01-25 DIAGNOSIS — R0683 Snoring: Secondary | ICD-10-CM

## 2020-01-25 DIAGNOSIS — J4540 Moderate persistent asthma, uncomplicated: Secondary | ICD-10-CM

## 2020-01-25 DIAGNOSIS — Z227 Latent tuberculosis: Secondary | ICD-10-CM

## 2020-01-25 DIAGNOSIS — G4719 Other hypersomnia: Secondary | ICD-10-CM

## 2020-01-25 DIAGNOSIS — G2581 Restless legs syndrome: Secondary | ICD-10-CM

## 2020-01-25 DIAGNOSIS — G4710 Hypersomnia, unspecified: Secondary | ICD-10-CM | POA: Diagnosis not present

## 2020-01-30 DIAGNOSIS — G2581 Restless legs syndrome: Secondary | ICD-10-CM | POA: Insufficient documentation

## 2020-01-30 DIAGNOSIS — G4719 Other hypersomnia: Secondary | ICD-10-CM | POA: Insufficient documentation

## 2020-01-30 DIAGNOSIS — R0683 Snoring: Secondary | ICD-10-CM | POA: Insufficient documentation

## 2020-01-30 NOTE — Progress Notes (Signed)
IMPRESSION: The total sleep time was just being short of 5.5 hours with normal sleep architecture.   1. No evidence of sleep disordered breathing, of PLMs, abnormal EEG or EKG. No hypoxemia.   RECOMMENDATIONS: I have found no explanation for the patient's reported excessive daytime sleepiness.

## 2020-01-30 NOTE — Procedures (Signed)
PATIENT'S NAME:  Paula Williams, [place] DOB:      [birth year]      MR#:    [id number]     DATE OF RECORDING: 01/25/2020  MR REFERRING M.D.:  Kathe Becton, FNP Study Performed:   Baseline Polysomnogram HISTORY:   Paula Williams is a right -handed African female with a possible sleep disorder and has a past medical history of Asthma, H/O colposcopy with cervical biopsy (2003), H/O herpes zoster, H/O postherpetic neuropathy, History of BCG vaccination, History of measles, mumps, or rubella, HIV infection ([place]), Periodic limb movement disorder, Positive PPD, treated,  Prediabetes, Pulmonary embolism ([place]) (2004), -blood-clot, obstetric, postpartum condition.  The patient had the first sleep study in the year 2016 without result.   Sleep relevant medical history:  Excessively daytime sleepy, sleeping 6 hours at night- but feels tired even if she has slept 10 hours. RLS. Believes she snores.  The patient endorsed the Epworth Sleepiness Scale at 14/24 points.   The patient's weight 235 pounds with a height of 66 (inches), resulting in a BMI of 37.9 kg/m2. The patient's neck circumference measured 15.5 inches.  CURRENT MEDICATIONS: Proventil, Vitamin C, Qvar, Zyrtec, Vitamin D, Genvoya, Advil, Atrovent, Vitamin A   PROCEDURE:  This is a multichannel digital polysomnogram utilizing the Somnostar 11.2 system.  Electrodes and sensors were applied and monitored per AASM Specifications.   EEG, EOG, Chin and Limb EMG, were sampled at 200 Hz.  ECG, Snore and Nasal Pressure, Thermal Airflow, Respiratory Effort, CPAP Flow and Pressure, Oximetry was sampled at 50 Hz. Digital video and audio were recorded.      BASELINE STUDY: Lights Out was at 22:14 and Lights On at 04:25.  Total recording time (TRT) was 371.5 minutes, with a total sleep time (TST) of 325 minutes.   The patient's sleep latency was 16.5 minutes.  REM latency was 86.5 minutes.  The sleep efficiency was 87.5 %.     SLEEP ARCHITECTURE: WASO  (Wake after sleep onset) was 37.5 minutes.  There were 10.5 minutes in Stage N1, 212 minutes Stage N2, 60 minutes Stage N3 and 42.5 minutes in Stage REM.  The percentage of Stage N1 was 3.2%, Stage N2 was 65.2%, Stage N3 was 18.5% and Stage R (REM sleep) was 13.1%.     RESPIRATORY ANALYSIS:  There were a total of 9 respiratory events:  1 obstructive apneas, 0 central apneas and 0 mixed apneas with a total of 1 apneas and an apnea index (AI) of .2 /hour. There were 8 hypopneas with a hypopnea index of 1.5 /hour. The patient also had 0 respiratory event related arousals (RERAs).     The total APNEA/HYPOPNEA INDEX (AHI) was 1.7/hour.  8 events occurred in REM sleep and 2 events in NREM. The REM AHI was 11.3 /hour, versus a non-REM AHI of 0.2. The patient spent 75.5 minutes of total sleep time in the supine position and 250 minutes in non-supine. The supine AHI was 0.0 versus a non-supine AHI of 2.1.  OXYGEN SATURATION & C02:  The Wake baseline 02 saturation was 94%, with the lowest being 89%. Time spent below 89% saturation equaled 0 minutes.  The arousals were noted as: 40 were spontaneous, 0 were associated with PLMs, 1 was associated with respiratory events. The patient had a total of 0 Periodic Limb Movements  Audio and video analysis did not show any abnormal or unusual movements, behaviors, phonations or vocalizations.    EKG was in keeping with normal sinus rhythm (NSR), had isolated PVCs.  IMPRESSION: The total sleep time was just being short of 5.5 hours with normal sleep architecture.   1. No evidence of sleep disordered breathing, of PLMs, abnormal EEG or EKG. No hypoxemia.   RECOMMENDATIONS: I have found no explanation for the patient's reported excessive daytime sleepiness.   I certify that I have reviewed the entire raw data recording prior to the issuance of this report in accordance with the Standards of Accreditation of the American Academy of Sleep Medicine (AASM)   Larey Seat, MD Diplomat, American Board of Psychiatry and Neurology  Diplomat, American Board of Sleep Medicine Market researcher, [place] [facility] [employer]

## 2020-01-31 ENCOUNTER — Other Ambulatory Visit: Payer: Self-pay | Admitting: Neurology

## 2020-01-31 ENCOUNTER — Telehealth: Payer: Self-pay | Admitting: Neurology

## 2020-01-31 ENCOUNTER — Encounter: Payer: Self-pay | Admitting: Neurology

## 2020-01-31 DIAGNOSIS — G2581 Restless legs syndrome: Secondary | ICD-10-CM

## 2020-01-31 DIAGNOSIS — G4719 Other hypersomnia: Secondary | ICD-10-CM

## 2020-01-31 DIAGNOSIS — R0683 Snoring: Secondary | ICD-10-CM

## 2020-01-31 DIAGNOSIS — G4730 Sleep apnea, unspecified: Secondary | ICD-10-CM

## 2020-01-31 NOTE — Telephone Encounter (Signed)
-----   Message from Darleen Crocker, RN sent at 01/31/2020  8:23 AM EST -----  ----- Message ----- From: Larey Seat, MD Sent: 01/30/2020   5:02 PM EST To: Larey Seat, MD

## 2020-01-31 NOTE — Telephone Encounter (Signed)
Spoke with Dr Brett Fairy and she agrees it would be worth completing work up for narcolepsy given the daytime sleepiness she describes and the normal sleep study. I have placed the ordered for overnight and MSLT. Reviewed the medication list with Dr Brett Fairy. Patient will have to be off the modafinil for 14 days and zyrtec 7-14 days prior to testing. Education on not taking over the counter supplements with sedating or stimulating effects.

## 2020-01-31 NOTE — Telephone Encounter (Signed)
Called the patient and reviewed the sleep study in detail. Advised there was no indication of sleep disorder present during the overnight study. Advised that I would discuss with Dr Brett Fairy and see if she thinks it is worth assessing/ruling out Narcolepsy.  She describes sleep attacks during the day, epworth 14/24.  Advised the patient I will contact her after discussing wit Dr Brett Fairy.

## 2020-02-02 ENCOUNTER — Telehealth: Payer: Self-pay

## 2020-02-02 NOTE — Telephone Encounter (Signed)
LVM for pt to call me back to schedule sleep study  

## 2020-02-06 ENCOUNTER — Telehealth: Payer: Self-pay

## 2020-02-06 NOTE — Telephone Encounter (Signed)
LVM for pt to call me back to schedule sleep study  

## 2020-02-14 ENCOUNTER — Telehealth: Payer: Self-pay

## 2020-02-14 NOTE — Telephone Encounter (Signed)
We have attempted to call the patient two times to schedule sleep study.  Patient has been unavailable at the phone numbers we have on file and has not returned our calls. If patient calls back we will schedule them for their sleep study.  

## 2020-04-06 ENCOUNTER — Other Ambulatory Visit: Payer: Self-pay

## 2020-04-06 ENCOUNTER — Encounter: Payer: Self-pay | Admitting: Family Medicine

## 2020-04-06 ENCOUNTER — Ambulatory Visit (INDEPENDENT_AMBULATORY_CARE_PROVIDER_SITE_OTHER): Payer: BC Managed Care – PPO | Admitting: Family Medicine

## 2020-04-06 VITALS — BP 154/73 | HR 78 | Temp 97.5°F | Ht 66.0 in | Wt 240.0 lb

## 2020-04-06 DIAGNOSIS — Z09 Encounter for follow-up examination after completed treatment for conditions other than malignant neoplasm: Secondary | ICD-10-CM

## 2020-04-06 DIAGNOSIS — Z0000 Encounter for general adult medical examination without abnormal findings: Secondary | ICD-10-CM

## 2020-04-06 DIAGNOSIS — R7303 Prediabetes: Secondary | ICD-10-CM

## 2020-04-06 DIAGNOSIS — M25562 Pain in left knee: Secondary | ICD-10-CM | POA: Diagnosis not present

## 2020-04-06 DIAGNOSIS — B20 Human immunodeficiency virus [HIV] disease: Secondary | ICD-10-CM | POA: Diagnosis not present

## 2020-04-06 DIAGNOSIS — F419 Anxiety disorder, unspecified: Secondary | ICD-10-CM | POA: Diagnosis not present

## 2020-04-06 MED ORDER — PREDNISONE 10 MG PO TABS: ORAL_TABLET | ORAL | 0 refills | Status: DC

## 2020-04-06 MED ORDER — METHYLPREDNISOLONE SODIUM SUCC 125 MG IJ SOLR
125.0000 mg | Freq: Once | INTRAMUSCULAR | Status: AC
  Administered 2020-04-06: 125 mg via INTRAMUSCULAR

## 2020-04-06 MED ORDER — OXYCODONE-ACETAMINOPHEN 5-325 MG PO TABS: 1.0000 | ORAL_TABLET | Freq: Two times a day (BID) | ORAL | 0 refills | Status: DC | PRN

## 2020-04-06 NOTE — Patient Instructions (Signed)
Prednisone tablets What is this medicine? PREDNISONE (PRED ni sone) is a corticosteroid. It is commonly used to treat inflammation of the skin, joints, lungs, and other organs. Common conditions treated include asthma, allergies, and arthritis. It is also used for other conditions, such as blood disorders and diseases of the adrenal glands. This medicine may be used for other purposes; ask your health care provider or pharmacist if you have questions. COMMON BRAND NAME(S): Deltasone, Predone, Sterapred, Sterapred DS What should I tell my health care provider before I take this medicine? They need to know if you have any of these conditions:  Cushing's syndrome  diabetes  glaucoma  heart disease  high blood pressure  infection (especially a virus infection such as chickenpox, cold sores, or herpes)  kidney disease  liver disease  mental illness  myasthenia gravis  osteoporosis  seizures  stomach or intestine problems  thyroid disease  an unusual or allergic reaction to lactose, prednisone, other medicines, foods, dyes, or preservatives  pregnant or trying to get pregnant  breast-feeding How should I use this medicine? Take this medicine by mouth with a glass of water. Follow the directions on the prescription label. Take this medicine with food. If you are taking this medicine once a day, take it in the morning. Do not take more medicine than you are told to take. Do not suddenly stop taking your medicine because you may develop a severe reaction. Your doctor will tell you how much medicine to take. If your doctor wants you to stop the medicine, the dose may be slowly lowered over time to avoid any side effects. Talk to your pediatrician regarding the use of this medicine in children. Special care may be needed. Overdosage: If you think you have taken too much of this medicine contact a poison control center or emergency room at once. NOTE: This medicine is only for you.  Do not share this medicine with others. What if I miss a dose? If you miss a dose, take it as soon as you can. If it is almost time for your next dose, talk to your doctor or health care professional. You may need to miss a dose or take an extra dose. Do not take double or extra doses without advice. What may interact with this medicine? Do not take this medicine with any of the following medications:  metyrapone  mifepristone This medicine may also interact with the following medications:  aminoglutethimide  amphotericin B  aspirin and aspirin-like medicines  barbiturates  certain medicines for diabetes, like glipizide or glyburide  cholestyramine  cholinesterase inhibitors  cyclosporine  digoxin  diuretics  ephedrine  female hormones, like estrogens and birth control pills  isoniazid  ketoconazole  NSAIDS, medicines for pain and inflammation, like ibuprofen or naproxen  phenytoin  rifampin  toxoids  vaccines  warfarin This list may not describe all possible interactions. Give your health care provider a list of all the medicines, herbs, non-prescription drugs, or dietary supplements you use. Also tell them if you smoke, drink alcohol, or use illegal drugs. Some items may interact with your medicine. What should I watch for while using this medicine? Visit your doctor or health care professional for regular checks on your progress. If you are taking this medicine over a prolonged period, carry an identification card with your name and address, the type and dose of your medicine, and your doctor's name and address. This medicine may increase your risk of getting an infection. Tell your doctor or   health care professional if you are around anyone with measles or chickenpox, or if you develop sores or blisters that do not heal properly. If you are going to have surgery, tell your doctor or health care professional that you have taken this medicine within the last  twelve months. Ask your doctor or health care professional about your diet. You may need to lower the amount of salt you eat. This medicine may increase blood sugar. Ask your healthcare provider if changes in diet or medicines are needed if you have diabetes. What side effects may I notice from receiving this medicine? Side effects that you should report to your doctor or health care professional as soon as possible:  allergic reactions like skin rash, itching or hives, swelling of the face, lips, or tongue  changes in emotions or moods  changes in vision  depressed mood  eye pain  fever or chills, cough, sore throat, pain or difficulty passing urine  signs and symptoms of high blood sugar such as being more thirsty or hungry or having to urinate more than normal. You may also feel very tired or have blurry vision.  swelling of ankles, feet Side effects that usually do not require medical attention (report to your doctor or health care professional if they continue or are bothersome):  confusion, excitement, restlessness  headache  nausea, vomiting  skin problems, acne, thin and shiny skin  trouble sleeping  weight gain This list may not describe all possible side effects. Call your doctor for medical advice about side effects. You may report side effects to FDA at 1-800-FDA-1088. Where should I keep my medicine? Keep out of the reach of children. Store at room temperature between 15 and 30 degrees C (59 and 86 degrees F). Protect from light. Keep container tightly closed. Throw away any unused medicine after the expiration date. NOTE: This sheet is a summary. It may not cover all possible information. If you have questions about this medicine, talk to your doctor, pharmacist, or health care provider.  2021 Elsevier/Gold Standard (2017-11-03 10:54:22) Acetaminophen; Oxycodone tablets What is this medicine? ACETAMINOPHEN; OXYCODONE (a set a MEE noe fen; ox i KOE done) is a  pain reliever. It is used to treat moderate to severe pain. This medicine may be used for other purposes; ask your health care provider or pharmacist if you have questions. COMMON BRAND NAME(S): Endocet, Magnacet, Nalocet, Narvox, Percocet, Perloxx, Primalev, Primlev, Prolate, Roxicet, Xolox What should I tell my health care provider before I take this medicine? They need to know if you have any of these conditions:  brain tumor  drug abuse or addiction  head injury  heart disease  if you often drink alcohol   kidney disease   liver disease  low adrenal gland function  lung disease, asthma, or breathing problem  seizures  stomach or intestine problems  taken an MAOI like Marplan, Nardil, or Parnate in the last 14 days  an unusual or allergic reaction to acetaminophen, oxycodone, other medicines, foods, dyes, or preservative  pregnant or trying to get pregnant  breast-feeding How should I use this medicine? Take this medicine by mouth with a full glass of water. Take it as directed on the label. You can take it with or without food. If it upsets your stomach, take it with food. Do not use it more often than directed. There may be unused or extra doses in the bottle after you finish your treatment. Talk to your health care provider if you  have questions about your dose. A special MedGuide will be given to you by the pharmacist with each prescription and refill. Be sure to read this information carefully each time. Talk to your health care provider about the use of this medicine in children. Special care may be needed. Patients over 17 years of age may have a stronger reaction and need a smaller dose. Overdosage: If you think you have taken too much of this medicine contact a poison control center or emergency room at once. NOTE: This medicine is only for you. Do not share this medicine with others. What if I miss a dose? This does not apply. This medicine is not for regular  use. It should only be used as needed. What may interact with this medicine? This medicine may interact with the following medications:  alcohol  antihistamines for allergy, cough and cold  antiviral medicines for HIV or AIDS  atropine  certain antibiotics like clarithromycin, erythromycin, linezolid, rifampin  certain medicines for anxiety or sleep  certain medicines for bladder problems like oxybutynin, tolterodine  certain medicines for depression like amitriptyline, fluoxetine, sertraline  certain medicines for fungal infections like ketoconazole, itraconazole, voriconazole  certain medicines for migraine headache like almotriptan, eletriptan, frovatriptan, naratriptan, rizatriptan, sumatriptan, zolmitriptan  certain medicines for nausea or vomiting like dolasetron, ondansetron, palonosetron  certain medicines for Parkinson's disease like benztropine, trihexyphenidyl  certain medicines for seizures like phenobarbital, phenytoin, primidone  certain medicines for stomach problems like dicyclomine, hyoscyamine  certain medicines for travel sickness like scopolamine  diuretics  general anesthetics like halothane, isoflurane, methoxyflurane, propofol  ipratropium  local anesthetics like lidocaine, pramoxine, tetracaine  MAOIs like Carbex, Eldepryl, Marplan, Nardil, and Parnate  medicines that relax muscles for surgery  methylene blue  nilotinib  other medicines with acetaminophen  other narcotic medicines for pain or cough  phenothiazines like chlorpromazine, mesoridazine, prochlorperazine, thioridazine This list may not describe all possible interactions. Give your health care provider a list of all the medicines, herbs, non-prescription drugs, or dietary supplements you use. Also tell them if you smoke, drink alcohol, or use illegal drugs. Some items may interact with your medicine. What should I watch for while using this medicine? Tell your health care  provider if your pain does not go away, if it gets worse, or if you have new or a different type of pain. You may develop tolerance to this drug. Tolerance means that you will need a higher dose of the drug for pain relief. Tolerance is normal and is expected if you take this drug for a long time. There are different types of narcotic drugs (opioids) for pain. If you take more than one type at the same time, you may have more side effects. Give your health care provider a list of all drugs you use. He or she will tell you how much drug to take. Do not take more drug than directed. Get emergency help right away if you have problems breathing. Do not suddenly stop taking your drug because you may develop a severe reaction. Your body becomes used to the drug. This does NOT mean you are addicted. Addiction is a behavior related to getting and using a drug for a nonmedical reason. If you have pain, you have a medical reason to take pain drug. Your health care provider will tell you how much drug to take. If your health care provider wants you to stop the drug, the dose will be slowly lowered over time to avoid any side effects.  Talk to your health care provider about naloxone and how to get it. Naloxone is an emergency drug used for an opioid overdose. An overdose can happen if you take too much opioid. It can also happen if an opioid is taken with some other drugs or substances, like alcohol. Know the symptoms of an overdose, like trouble breathing, unusually tired or sleepy, or not being able to respond or wake up. Make sure to tell caregivers and close contacts where it is stored. Make sure they know how to use it. After naloxone is given, you must get emergency help right away. Naloxone is a temporary treatment. Repeat doses may be needed. Do not take other drugs that contain acetaminophen with this drug. Many non-prescription drugs contain acetaminophen. Always read labels carefully. If you have questions, ask  your health care provider. If you take too much acetaminophen, get medical help right away. Too much acetaminophen can be very dangerous and cause liver damage. Even if you do not have symptoms, it is important to get help right away. This drug does not prevent a heart attack or stroke. This drug may increase the chance of a heart attack or stroke. The chance may increase the longer you use this drug or if you have heart disease. If you take aspirin to prevent a heart attack or stroke, talk to your health care provider about using this drug. You may get drowsy or dizzy. Do not drive, use machinery, or do anything that needs mental alertness until you know how this drug affects you. Do not stand up or sit up quickly, especially if you are an older patient. This reduces the risk of dizzy or fainting spells. Alcohol may interfere with the effect of this drug. Avoid alcoholic drinks. This drug will cause constipation. If you do not have a bowel movement for 3 days, call your health care provider. Your mouth may get dry. Chewing sugarless gum or sucking hard candy and drinking plenty of water may help. Contact your health care provider if the problem does not go away or is severe. What side effects may I notice from receiving this medicine? Side effects that you should report to your doctor or health care professional as soon as possible:  allergic reactions (skin rash, itching or hives; swelling of the face, lips, or tongue)  confusion  kidney injury (trouble passing urine or change in the amount of urine)  light-colored stool  liver injury (dark yellow or brown urine; general ill feeling or flu-like symptoms; loss of appetite, right upper belly pain; unusually weak or tired, yellowing of the eyes or skin)  low adrenal gland function (nausea; vomiting; loss of appetite; unusually weak or tired; dizziness; low blood pressure)  low blood pressure (dizziness; feeling faint or lightheaded, falls;  unusually weak or tired)  redness, blistering, peeling, or loosening of the skin, including inside the mouth  serotonin syndrome (irritable; confusion; diarrhea; fast or irregular heartbeat; muscle twitching; stiff muscles; trouble walking; sweating; high fever; seizures; chills; vomiting)  trouble breathing Side effects that usually do not require medical attention (report to your doctor or health care professional if they continue or are bothersome):  constipation  dry mouth  nausea, vomiting  tiredness This list may not describe all possible side effects. Call your doctor for medical advice about side effects. You may report side effects to FDA at 1-800-FDA-1088. Where should I keep my medicine? Keep out of the reach of children and pets. This medicine can be abused. Keep it  in a safe place to protect it from theft. Do not share it with anyone. It is only for you. Selling or giving away this medicine is dangerous and against the law. Store at room temperature between 20 and 25 degrees C (68 and 77 degrees F). Protect from light. Get rid of any unused medicine after the expiration date. This medicine may cause harm and death if it is taken by other adults, children, or pets. It is important to get rid of the medicine as soon as you no longer need it or it is expired. You can do this in two ways:  Take the medicine to a medicine take-back program. Check with your pharmacy or law enforcement to find a location.  If you cannot return the medicine, flush it down the toilet. NOTE: This sheet is a summary. It may not cover all possible information. If you have questions about this medicine, talk to your doctor, pharmacist, or health care provider.  2021 Elsevier/Gold Standard (2019-11-02 11:12:15) Acute Knee Pain, Adult Many things can cause knee pain. Sometimes, knee pain is sudden (acute) and may be caused by damage, swelling, or irritation of the muscles and tissues that support your  knee. The pain often goes away on its own with time and rest. If the pain does not go away, tests may be done to find out what is causing the pain. Follow these instructions at home: If you have a knee sleeve or brace:  Wear the knee sleeve or brace as told by your doctor. Take it off only as told by your doctor.  Loosen it if your toes: ? Tingle. ? Become numb. ? Turn cold and blue.  Keep it clean.  If the knee sleeve or brace is not waterproof: ? Do not let it get wet. ? Cover it with a watertight covering when you take a bath or shower.   Activity  Rest your knee.  Do not do things that cause pain or make pain worse.  Avoid activities where both feet leave the ground at the same time (high-impact activities). Examples are running, jumping rope, and doing jumping jacks.  Work with a physical therapist to make a safe exercise program, as told by your doctor. Managing pain, stiffness, and swelling  If told, put ice on the knee. To do this: ? If you have a removable knee sleeve or brace, take it off as told by your doctor. ? Put ice in a plastic bag. ? Place a towel between your skin and the bag. ? Leave the ice on for 20 minutes, 2-3 times a day. ? Take off the ice if your skin turns bright red. This is very important. If you cannot feel pain, heat, or cold, you have a greater risk of damage to the area.  If told, use an elastic bandage to put pressure (compression) on your injured knee.  Raise your knee above the level of your heart while you are sitting or lying down.  Sleep with a pillow under your knee.   General instructions  Take over-the-counter and prescription medicines only as told by your doctor.  Do not smoke or use any products that contain nicotine or tobacco. If you need help quitting, ask your doctor.  If you are overweight, work with your doctor and a food expert (dietitian) to set goals to lose weight. Being overweight can make your knee hurt  more.  Watch for any changes in your symptoms.  Keep all follow-up visits. Contact a  doctor if:  The knee pain does not stop.  The knee pain changes or gets worse.  You have a fever along with knee pain.  Your knee is red or feels warm when you touch it.  Your knee gives out or locks up. Get help right away if:  Your knee swells, and the swelling gets worse.  You cannot move your knee.  You have very bad knee pain that does not get better with pain medicine. Summary  Many things can cause knee pain. The pain often goes away on its own with time and rest.  Your doctor may do tests to find out the cause of the pain.  Watch for any changes in your symptoms. Relieve your pain with rest, medicines, light activity, and use of ice.  Get help right away if you cannot move your knee or your knee pain is very bad. This information is not intended to replace advice given to you by your health care provider. Make sure you discuss any questions you have with your health care provider. Document Revised: 07/20/2019 Document Reviewed: 07/20/2019 Elsevier Patient Education  2021 [employer].
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
## 2020-04-07 LAB — COMPREHENSIVE METABOLIC PANEL
ALT: 17 IU/L (ref 0–32)
AST: 21 IU/L (ref 0–40)
Albumin/Globulin Ratio: 1.5 (ref 1.2–2.2)
Albumin: 4.3 g/dL (ref 3.8–4.9)
Alkaline Phosphatase: 73 IU/L (ref 44–121)
BUN/Creatinine Ratio: 15 (ref 9–23)
BUN: 15 mg/dL (ref 6–24)
Bilirubin Total: <0.2 mg/dL (ref 0.0–1.2)
CO2: 22 mmol/L (ref 20–29)
Calcium: 9.1 mg/dL (ref 8.7–10.2)
Chloride: 101 mmol/L (ref 96–106)
Creatinine, Ser: 0.98 mg/dL (ref 0.57–1.00)
GFR calc Af Amer: 77 mL/min/{1.73_m2} (ref >59)
GFR calc non Af Amer: 67 mL/min/{1.73_m2} (ref >59)
Globulin, Total: 2.8 g/dL (ref 1.5–4.5)
Glucose: 121 mg/dL — ABNORMAL HIGH (ref 65–99)
Potassium: 4.4 mmol/L (ref 3.5–5.2)
Sodium: 140 mmol/L (ref 134–144)
Total Protein: 7.1 g/dL (ref 6.0–8.5)

## 2020-04-07 LAB — CBC WITH DIFFERENTIAL/PLATELET
Basophils Absolute: 0 10*3/uL (ref 0.0–0.2)
Basos: 0 %
EOS (ABSOLUTE): 0.2 10*3/uL (ref 0.0–0.4)
Eos: 3 %
Hematocrit: 39.1 % (ref 34.0–46.6)
Hemoglobin: 13.6 g/dL (ref 11.1–15.9)
Immature Grans (Abs): 0 10*3/uL (ref 0.0–0.1)
Immature Granulocytes: 0 %
Lymphocytes Absolute: 2.6 10*3/uL (ref 0.7–3.1)
Lymphs: 53 %
MCH: 31.6 pg (ref 26.6–33.0)
MCHC: 34.8 g/dL (ref 31.5–35.7)
MCV: 91 fL (ref 79–97)
Monocytes Absolute: 0.4 10*3/uL (ref 0.1–0.9)
Monocytes: 7 %
Neutrophils Absolute: 1.9 10*3/uL (ref 1.4–7.0)
Neutrophils: 37 %
Platelets: 216 10*3/uL (ref 150–450)
RBC: 4.31 x10E6/uL (ref 3.77–5.28)
RDW: 13.1 % (ref 11.7–15.4)
WBC: 5 10*3/uL (ref 3.4–10.8)

## 2020-04-07 LAB — LIPID PANEL
Chol/HDL Ratio: 4.9 ratio — ABNORMAL HIGH (ref 0.0–4.4)
Cholesterol, Total: 229 mg/dL — ABNORMAL HIGH (ref 100–199)
HDL: 47 mg/dL (ref >39)
LDL Chol Calc (NIH): 154 mg/dL — ABNORMAL HIGH (ref 0–99)
Triglycerides: 153 mg/dL — ABNORMAL HIGH (ref 0–149)
VLDL Cholesterol Cal: 28 mg/dL (ref 5–40)

## 2020-04-07 LAB — TSH: TSH: 0.211 u[IU]/mL — ABNORMAL LOW (ref 0.450–4.500)

## 2020-04-07 LAB — VITAMIN D 25 HYDROXY (VIT D DEFICIENCY, FRACTURES): Vit D, 25-Hydroxy: 24.1 ng/mL — ABNORMAL LOW (ref 30.0–100.0)

## 2020-04-07 LAB — IRON AND TIBC
Iron Saturation: 28 % (ref 15–55)
Iron: 71 ug/dL (ref 27–159)
Total Iron Binding Capacity: 252 ug/dL (ref 250–450)
UIBC: 181 ug/dL (ref 131–425)

## 2020-04-07 LAB — VITAMIN B12: Vitamin B-12: 513 pg/mL (ref 232–1245)

## 2020-04-17 ENCOUNTER — Other Ambulatory Visit: Payer: BC Managed Care – PPO

## 2020-04-18 ENCOUNTER — Other Ambulatory Visit: Payer: Self-pay

## 2020-04-18 ENCOUNTER — Other Ambulatory Visit: Payer: BC Managed Care – PPO

## 2020-04-18 DIAGNOSIS — B20 Human immunodeficiency virus [HIV] disease: Secondary | ICD-10-CM

## 2020-04-19 LAB — T-HELPER CELL (CD4) - (RCID CLINIC ONLY)
CD4 % Helper T Cell: 27 % — ABNORMAL LOW (ref 33–65)
CD4 T Cell Abs: 796 /uL (ref 400–1790)

## 2020-04-20 LAB — HIV-1 RNA QUANT-NO REFLEX-BLD
HIV 1 RNA Quant: <20 Copies/mL — ABNORMAL HIGH
HIV-1 RNA Quant, Log: <1.3 Log cps/mL — ABNORMAL HIGH

## 2020-05-02 ENCOUNTER — Encounter: Payer: BC Managed Care – PPO | Admitting: Internal Medicine

## 2020-05-04 ENCOUNTER — Encounter: Payer: BC Managed Care – PPO | Admitting: Family Medicine

## 2020-05-09 ENCOUNTER — Encounter: Payer: Self-pay | Admitting: Internal Medicine

## 2020-05-09 ENCOUNTER — Ambulatory Visit: Payer: BC Managed Care – PPO | Admitting: Internal Medicine

## 2020-05-09 ENCOUNTER — Other Ambulatory Visit: Payer: Self-pay

## 2020-05-09 VITALS — BP 165/91 | HR 86 | Wt 242.0 lb

## 2020-05-09 DIAGNOSIS — Z113 Encounter for screening for infections with a predominantly sexual mode of transmission: Secondary | ICD-10-CM | POA: Diagnosis not present

## 2020-05-09 DIAGNOSIS — R5382 Chronic fatigue, unspecified: Secondary | ICD-10-CM

## 2020-05-09 DIAGNOSIS — R739 Hyperglycemia, unspecified: Secondary | ICD-10-CM

## 2020-05-09 DIAGNOSIS — G4719 Other hypersomnia: Secondary | ICD-10-CM

## 2020-05-09 DIAGNOSIS — B20 Human immunodeficiency virus [HIV] disease: Secondary | ICD-10-CM | POA: Diagnosis not present

## 2020-05-09 NOTE — Assessment & Plan Note (Signed)
I reviewed the previous elevated glucose and normal Hgb A1c.  She will continue to monitor her sugar with her PCP.

## 2020-05-09 NOTE — Assessment & Plan Note (Signed)
She continues to do well with a suppressed virus and good immune system.  Due to her fatigue and her concern with it being medication induced, I did offer to change her regimen multiple times but she was reluctant to do that and just wanted a blood test done to see if it was the medication.  I reviewed her electrolytes with her and reassured her that no obvious side effects but the only way to know if it is the medication is to change to something else.  She did not want to change.

## 2020-05-09 NOTE — Progress Notes (Signed)
   Subjective:    Patient ID: Paula Williams, female    DOB: 11-14-1970, 50 y.o.   MRN: [id number]  HPI Here for follow up of HIV She continues on Genvoya and denies any missed doses.  Her CD4 is 796 and viral load < 20.  She is asking about her lipid panel that was done by her primary care provider last month.     She is frustrated with me for not solving her issue of fatigue and asking me to do a blood test to determine if her HIV medication is the issue.  She is asking me why I have not evaluated her at all for her fatigue issues which have been chronic.  She feels it stems from her HIV or HIV medication.  She did go to the sleep specialist Dr Brett Fairy and underwent an EEG and she tells me she does not have any follow up with her and wants something done.  She did not know they were trying to set up a sleep study and couldn't get ahold of her.     Review of Systems  Constitutional: Positive for fatigue. Negative for fever and unexpected weight change.  Gastrointestinal: Negative for diarrhea and nausea.  Skin: Negative for rash.  Psychiatric/Behavioral: Positive for decreased concentration. Negative for suicidal ideas.       Objective:   Physical Exam Eyes:     General: No scleral icterus. Cardiovascular:     Rate and Rhythm: Normal rate and regular rhythm.  Pulmonary:     Effort: Pulmonary effort is normal.  Neurological:     General: No focal deficit present.     Mental Status: She is alert.  Psychiatric:        Behavior: Behavior normal.   SH; no tobacco, no alcohol        Assessment & Plan:

## 2020-05-09 NOTE — Assessment & Plan Note (Signed)
I again reviewed known causes of fatigue including medical causes such as hypothyroidism, diabetes and I reviewed our multiple previous discussions of this and the labs I did at that time that were normal.   I also reviewed other causes including depression and/or anxiety and offered to refer her to counseling to evaluate this, though she was reluctant.  I also reviewed again the reason that I referred her to a specialist and that she needs to continue to follow up and I showed her the messages of them trying to get a sleep study done that she had not responded to.  She was given the phone number of the clinic to get back to them and scheduled.  I encouraged continued follow up with them.

## 2020-05-11 ENCOUNTER — Ambulatory Visit: Payer: BC Managed Care – PPO | Admitting: Family Medicine

## 2020-05-11 ENCOUNTER — Encounter: Payer: Self-pay | Admitting: Family Medicine

## 2020-05-11 ENCOUNTER — Other Ambulatory Visit: Payer: Self-pay

## 2020-05-11 VITALS — BP 155/69 | HR 83 | Temp 98.1°F | Ht 66.0 in | Wt 242.0 lb

## 2020-05-11 DIAGNOSIS — B20 Human immunodeficiency virus [HIV] disease: Secondary | ICD-10-CM | POA: Diagnosis not present

## 2020-05-11 DIAGNOSIS — R5383 Other fatigue: Secondary | ICD-10-CM

## 2020-05-11 DIAGNOSIS — F419 Anxiety disorder, unspecified: Secondary | ICD-10-CM

## 2020-05-11 DIAGNOSIS — R7303 Prediabetes: Secondary | ICD-10-CM

## 2020-05-11 DIAGNOSIS — Z0000 Encounter for general adult medical examination without abnormal findings: Secondary | ICD-10-CM | POA: Diagnosis not present

## 2020-05-11 DIAGNOSIS — Z09 Encounter for follow-up examination after completed treatment for conditions other than malignant neoplasm: Secondary | ICD-10-CM

## 2020-05-11 DIAGNOSIS — M25562 Pain in left knee: Secondary | ICD-10-CM

## 2020-05-11 MED ORDER — METHYLPREDNISOLONE SODIUM SUCC 125 MG IJ SOLR: 125.0000 mg | Freq: Once | INTRAMUSCULAR | Status: DC

## 2020-05-11 MED ORDER — PREDNISONE 10 MG PO TABS: ORAL_TABLET | ORAL | 0 refills | Status: DC
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
## 2020-05-12 LAB — IRON AND TIBC
Iron Saturation: 30 % (ref 15–55)
Iron: 74 ug/dL (ref 27–159)
Total Iron Binding Capacity: 248 ug/dL — ABNORMAL LOW (ref 250–450)
UIBC: 174 ug/dL (ref 131–425)

## 2020-05-15 ENCOUNTER — Encounter: Payer: Self-pay | Admitting: Family Medicine

## 2020-10-15 ENCOUNTER — Telehealth: Payer: Self-pay

## 2020-10-15 ENCOUNTER — Other Ambulatory Visit: Payer: Self-pay

## 2020-10-15 ENCOUNTER — Ambulatory Visit: Payer: Self-pay

## 2020-10-15 NOTE — Telephone Encounter (Signed)
[facility] Patient Advocate Encounter  Completed and sent [employer] Advancing Access application for Genvoya for this patient who is uninsured.    Patient is approved 10/15/20 through 02/16/21.      Ileene Patrick, [facility]ious [facility] Phone: [phone] Fax:  [phone]

## 2020-10-16 ENCOUNTER — Encounter: Payer: Self-pay | Admitting: Internal Medicine

## 2020-10-16 ENCOUNTER — Encounter: Payer: Self-pay | Admitting: Nurse Practitioner

## 2020-10-16 ENCOUNTER — Ambulatory Visit (INDEPENDENT_AMBULATORY_CARE_PROVIDER_SITE_OTHER): Payer: Self-pay | Admitting: Nurse Practitioner

## 2020-10-16 VITALS — BP 151/81 | HR 84 | Temp 97.1°F | Ht 66.0 in | Wt 254.0 lb

## 2020-10-16 DIAGNOSIS — E782 Mixed hyperlipidemia: Secondary | ICD-10-CM

## 2020-10-16 DIAGNOSIS — E038 Other specified hypothyroidism: Secondary | ICD-10-CM

## 2020-10-16 DIAGNOSIS — E559 Vitamin D deficiency, unspecified: Secondary | ICD-10-CM

## 2020-10-16 DIAGNOSIS — M25562 Pain in left knee: Secondary | ICD-10-CM

## 2020-10-16 DIAGNOSIS — R030 Elevated blood-pressure reading, without diagnosis of hypertension: Secondary | ICD-10-CM

## 2020-10-16 MED ORDER — METHYLPREDNISOLONE SODIUM SUCC 125 MG IJ SOLR
125.0000 mg | Freq: Once | INTRAMUSCULAR | Status: AC
  Administered 2020-10-16: 125 mg via INTRAMUSCULAR

## 2020-10-16 NOTE — Patient Instructions (Signed)
You were seen today in the [facility] for left knee pain. Labs were collected, results will be available via MyChart or, if abnormal, you will be contacted by clinic staff. Please take OTC Aleve, in addition to pain relief gel to assist with pain management. Please record your B/P x 1 wk and submit via MyChart. Please follow up in 6 mths for annual exam.
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
## 2020-10-17 ENCOUNTER — Other Ambulatory Visit: Payer: Self-pay | Admitting: Nurse Practitioner

## 2020-10-17 LAB — LIPID PANEL
Chol/HDL Ratio: 5.3 ratio — ABNORMAL HIGH (ref 0.0–4.4)
Cholesterol, Total: 224 mg/dL — ABNORMAL HIGH (ref 100–199)
HDL: 42 mg/dL (ref >39)
LDL Chol Calc (NIH): 131 mg/dL — ABNORMAL HIGH (ref 0–99)
Triglycerides: 283 mg/dL — ABNORMAL HIGH (ref 0–149)
VLDL Cholesterol Cal: 51 mg/dL — ABNORMAL HIGH (ref 5–40)

## 2020-10-17 LAB — VITAMIN D 25 HYDROXY (VIT D DEFICIENCY, FRACTURES): Vit D, 25-Hydroxy: 24 ng/mL — ABNORMAL LOW (ref 30.0–100.0)

## 2020-10-17 LAB — TSH: TSH: 0.243 u[IU]/mL — ABNORMAL LOW (ref 0.450–4.500)

## 2020-10-17 MED ORDER — PITAVASTATIN CALCIUM 1 MG PO TABS: 1.0000 mg | ORAL_TABLET | Freq: Every day | ORAL | 2 refills | Status: DC

## 2020-10-17 MED ORDER — VITAMIN D (ERGOCALCIFEROL) 1.25 MG (50000 UNIT) PO CAPS: 50000.0000 [IU] | ORAL_CAPSULE | ORAL | 1 refills | Status: AC

## 2020-10-17 MED ORDER — OMEGA-3-ACID ETHYL ESTERS 1 G PO CAPS: 1.0000 g | ORAL_CAPSULE | Freq: Two times a day (BID) | ORAL | 1 refills | Status: DC

## 2020-10-18 NOTE — Progress Notes (Signed)
Patient notified of results, no questions or concerns.

## 2020-10-24 ENCOUNTER — Other Ambulatory Visit: Payer: Self-pay

## 2020-10-25 ENCOUNTER — Other Ambulatory Visit: Payer: Self-pay

## 2020-10-25 DIAGNOSIS — Z113 Encounter for screening for infections with a predominantly sexual mode of transmission: Secondary | ICD-10-CM

## 2020-10-25 DIAGNOSIS — B20 Human immunodeficiency virus [HIV] disease: Secondary | ICD-10-CM

## 2020-10-26 LAB — T-HELPER CELL (CD4) - (RCID CLINIC ONLY)
CD4 % Helper T Cell: 28 % — ABNORMAL LOW (ref 33–65)
CD4 T Cell Abs: 710 /uL (ref 400–1790)

## 2020-10-26 LAB — URINE CYTOLOGY ANCILLARY ONLY
Chlamydia: NEGATIVE
Comment: NEGATIVE
Comment: NORMAL
Neisseria Gonorrhea: NEGATIVE

## 2020-10-27 LAB — HIV-1 RNA QUANT-NO REFLEX-BLD
HIV 1 RNA Quant: 25 Copies/mL — ABNORMAL HIGH
HIV-1 RNA Quant, Log: 1.4 Log cps/mL — ABNORMAL HIGH

## 2020-10-27 LAB — RPR: RPR Ser Ql: NONREACTIVE

## 2020-10-30 ENCOUNTER — Telehealth: Payer: Self-pay

## 2020-11-01 NOTE — Telephone Encounter (Signed)
Error

## 2020-11-07 ENCOUNTER — Other Ambulatory Visit: Payer: Self-pay

## 2020-11-07 ENCOUNTER — Ambulatory Visit (INDEPENDENT_AMBULATORY_CARE_PROVIDER_SITE_OTHER): Payer: Self-pay | Admitting: Internal Medicine

## 2020-11-07 ENCOUNTER — Encounter: Payer: Self-pay | Admitting: Internal Medicine

## 2020-11-07 DIAGNOSIS — B20 Human immunodeficiency virus [HIV] disease: Secondary | ICD-10-CM

## 2020-11-07 DIAGNOSIS — Z113 Encounter for screening for infections with a predominantly sexual mode of transmission: Secondary | ICD-10-CM

## 2020-11-07 MED ORDER — GENVOYA 150-150-200-10 MG PO TABS: 1.0000 | ORAL_TABLET | Freq: Every evening | ORAL | 11 refills | Status: DC

## 2020-11-07 NOTE — Progress Notes (Signed)
   Subjective:    Patient ID: Paula Williams, female    DOB: 1970-06-29, 50 y.o.   MRN: [id number]  I connected with  Paula Williams on 11/07/20 by telephone and verified that I am speaking with the correct person using two identifiers.   I discussed the limitations of evaluation and management by telemedicine. The patient expressed understanding and agreed to proceed.  Location: Patient - [place] residence Physician - clinic  Duration of visit:  8 minutes  HPI She is contacted for follow up of HIV She continues on Genvoya and had a couple of missed doses due to losing her insurance but now approved for assistance and no issues.  No complaints voiced today.     Review of Systems  Constitutional:  Negative for appetite change.  Gastrointestinal:  Negative for diarrhea and nausea.  Skin:  Negative for rash.      Objective:   Physical Exam Neurological:     Mental Status: She is alert.  Psychiatric:        Mood and Affect: Mood normal.          Assessment & Plan:

## 2020-11-07 NOTE — Addendum Note (Signed)
Addended by: Thayer Headings on: 11/07/2020 02:19 PM   Modules accepted: Orders

## 2020-11-07 NOTE — Assessment & Plan Note (Signed)
Doing well on Genvoya, no new issues except for change of coverage. No changes and rtc in 6 months Will schedule for flu shot

## 2020-11-08 ENCOUNTER — Other Ambulatory Visit: Payer: Self-pay

## 2020-11-08 ENCOUNTER — Ambulatory Visit (INDEPENDENT_AMBULATORY_CARE_PROVIDER_SITE_OTHER): Payer: Self-pay

## 2020-11-08 DIAGNOSIS — Z23 Encounter for immunization: Secondary | ICD-10-CM

## 2020-11-15 ENCOUNTER — Other Ambulatory Visit: Payer: Self-pay | Admitting: Internal Medicine

## 2020-11-15 DIAGNOSIS — B20 Human immunodeficiency virus [HIV] disease: Secondary | ICD-10-CM

## 2020-11-15 NOTE — Telephone Encounter (Signed)
Cancelled with [facility] on [address] and resent to [facility] .

## 2020-11-21 ENCOUNTER — Other Ambulatory Visit: Payer: Self-pay

## 2020-11-21 DIAGNOSIS — B20 Human immunodeficiency virus [HIV] disease: Secondary | ICD-10-CM

## 2020-11-21 MED ORDER — GENVOYA 150-150-200-10 MG PO TABS
1.0000 | ORAL_TABLET | Freq: Every day | ORAL | 5 refills | Status: DC
Start: 2020-11-21 — End: 2021-05-22

## 2020-11-22 ENCOUNTER — Other Ambulatory Visit: Payer: Self-pay | Admitting: Internal Medicine

## 2020-11-22 DIAGNOSIS — B20 Human immunodeficiency virus [HIV] disease: Secondary | ICD-10-CM

## 2021-01-25 ENCOUNTER — Encounter: Payer: Self-pay | Admitting: Nurse Practitioner

## 2021-01-25 ENCOUNTER — Ambulatory Visit (INDEPENDENT_AMBULATORY_CARE_PROVIDER_SITE_OTHER): Payer: Self-pay | Admitting: Nurse Practitioner

## 2021-01-25 DIAGNOSIS — R6889 Other general symptoms and signs: Secondary | ICD-10-CM

## 2021-01-25 MED ORDER — OSELTAMIVIR PHOSPHATE 75 MG PO CAPS: 75.0000 mg | ORAL_CAPSULE | Freq: Two times a day (BID) | ORAL | 0 refills | Status: AC

## 2021-01-25 NOTE — Progress Notes (Signed)
Virtual Visit via Telephone Note  I connected with Paula Williams 50 y/o female  on 01/25/21 at  2:40 PM EST by telephone and verified that I am speaking with the correct person using two identifiers.   I discussed the limitations, risks, security and privacy concerns of performing an evaluation and management service by telephone and the availability of in person appointments. I also discussed with the patient that there may be a patient responsible charge related to this service. The patient expressed understanding and agreed to proceed.  Patient home Provider Office  History of Present Illness:  Paula Williams  has a past medical history of Asthma, Elective abortion, H/O colposcopy with cervical biopsy (2003), H/O herpes zoster, H/O peripheral neuropathy, History of BCG vaccination, History of measles, mumps, or rubella, HIV infection ([place]), Periodic limb movement disorder, Positive PPD, treated, Prediabetes, Pulmonary embolism ([place]) (2004), and Pulmonary embolism, blood-clot, obstetric, postpartum condition. To the [facility] via telephone for evaluation of flu like symptoms.  Patient states that she has had HA, runny nose with congestion, watery eyes and non productive cough x 1 day. Also endorses fever yesterday of 105 F. States that she feels that fever has decreased today and is more low grade after taking tylenol.Currently has HA at frontal portion of head which she rates a 6/10 and described as throbbing. Has been taking Tylenol for HA with temporary relief. Denies any known sick contacts. Has had decreased appetite, but drinking fluids regularly. Endorses having intermittent swelling and pain of her throat, but is able to swallow without difficulty.    Review of Systems  Constitutional:  Positive for fever and malaise/fatigue. Negative for chills.  HENT:  Positive for congestion and sore throat. Negative for ear discharge, ear pain, hearing loss, nosebleeds, sinus pain and  tinnitus.   Eyes:  Positive for discharge. Negative for blurred vision, double vision, photophobia, pain and redness.  Respiratory:  Positive for cough. Negative for hemoptysis, sputum production, shortness of breath, wheezing and stridor.   Cardiovascular:  Negative for chest pain, palpitations and leg swelling.  Gastrointestinal:  Negative for abdominal pain, blood in stool, constipation, diarrhea, nausea and vomiting.  Skin: Negative.   Neurological:  Positive for headaches. Negative for dizziness, tingling, tremors, sensory change, speech change, focal weakness, seizures, loss of consciousness and weakness.  Psychiatric/Behavioral:  Negative for depression. The patient is not nervous/anxious.   All other systems reviewed and are negative.   Observations/Objective: No exam; telephone visit  Assessment and Plan: 1. Flu-like symptoms - oseltamivir (TAMIFLU) 75 MG capsule; Take 1 capsule (75 mg total) by mouth 2 (two) times daily for 5 days.  Dispense: 10 capsule; Refill: 0 Informed to continue taking OTC medications as needed for symptoms Discussed diet options and the importance of hydration  Given ER precautions, informed that if fever increases again to seek immediate care    Follow Up Instructions: Follow up in 1-2 wks, if symptoms worsen or progress, sooner as needed    I discussed the assessment and treatment plan with the patient. The patient was provided an opportunity to ask questions and all were answered. The patient agreed with the plan and demonstrated an understanding of the instructions.   The patient was advised to call back or seek an in-person evaluation if the symptoms worsen or if the condition fails to improve as anticipated.  I provided 20 minutes of telephone- visit time during this encounter.   Teena Dunk, NP

## 2021-04-19 ENCOUNTER — Other Ambulatory Visit: Payer: Self-pay

## 2021-04-19 DIAGNOSIS — Z113 Encounter for screening for infections with a predominantly sexual mode of transmission: Secondary | ICD-10-CM

## 2021-04-19 DIAGNOSIS — B20 Human immunodeficiency virus [HIV] disease: Secondary | ICD-10-CM

## 2021-04-24 ENCOUNTER — Other Ambulatory Visit: Payer: Self-pay

## 2021-04-24 ENCOUNTER — Ambulatory Visit: Payer: Self-pay

## 2021-04-24 DIAGNOSIS — Z113 Encounter for screening for infections with a predominantly sexual mode of transmission: Secondary | ICD-10-CM

## 2021-04-24 DIAGNOSIS — B20 Human immunodeficiency virus [HIV] disease: Secondary | ICD-10-CM

## 2021-04-26 LAB — COMPLETE METABOLIC PANEL WITH GFR
AG Ratio: 1.3 (calc) (ref 1.0–2.5)
ALT: 16 U/L (ref 6–29)
AST: 14 U/L (ref 10–35)
Albumin: 4.3 g/dL (ref 3.6–5.1)
Alkaline phosphatase (APISO): 74 U/L (ref 37–153)
BUN: 16 mg/dL (ref 7–25)
CO2: 23 mmol/L (ref 20–32)
Calcium: 9.3 mg/dL (ref 8.6–10.4)
Chloride: 105 mmol/L (ref 98–110)
Creat: 0.97 mg/dL (ref 0.50–1.03)
Globulin: 3.3 g/dL (calc) (ref 1.9–3.7)
Glucose, Bld: 90 mg/dL (ref 65–99)
Potassium: 4.2 mmol/L (ref 3.5–5.3)
Sodium: 142 mmol/L (ref 135–146)
Total Bilirubin: 0.4 mg/dL (ref 0.2–1.2)
Total Protein: 7.6 g/dL (ref 6.1–8.1)
eGFR: 70 mL/min/{1.73_m2} (ref >=60)

## 2021-04-26 LAB — CBC WITH DIFFERENTIAL/PLATELET
Absolute Monocytes: 572 cells/uL (ref 200–950)
Basophils Absolute: 20 cells/uL (ref 0–200)
Basophils Relative: 0.3 %
Eosinophils Absolute: 221 cells/uL (ref 15–500)
Eosinophils Relative: 3.4 %
HCT: 41 % (ref 35.0–45.0)
Hemoglobin: 13.9 g/dL (ref 11.7–15.5)
Lymphs Abs: 3094 cells/uL (ref 850–3900)
MCH: 31.1 pg (ref 27.0–33.0)
MCHC: 33.9 g/dL (ref 32.0–36.0)
MCV: 91.7 fL (ref 80.0–100.0)
MPV: 11.2 fL (ref 7.5–12.5)
Monocytes Relative: 8.8 %
Neutro Abs: 2594 cells/uL (ref 1500–7800)
Neutrophils Relative %: 39.9 %
Platelets: 205 10*3/uL (ref 140–400)
RBC: 4.47 10*6/uL (ref 3.80–5.10)
RDW: 13 % (ref 11.0–15.0)
Total Lymphocyte: 47.6 %
WBC: 6.5 10*3/uL (ref 3.8–10.8)

## 2021-04-26 LAB — T-HELPER CELLS (CD4) COUNT (NOT AT ARMC)
Absolute CD4: 894 cells/uL (ref 490–1740)
CD4 T Helper %: 28 % — ABNORMAL LOW (ref 30–61)
Total lymphocyte count: 3227 cells/uL (ref 850–3900)

## 2021-04-26 LAB — HIV-1 RNA QUANT-NO REFLEX-BLD
HIV 1 RNA Quant: NOT DETECTED Copies/mL
HIV-1 RNA Quant, Log: NOT DETECTED Log cps/mL

## 2021-04-26 LAB — C. TRACHOMATIS/N. GONORRHOEAE RNA
C. trachomatis RNA, TMA: NOT DETECTED
N. gonorrhoeae RNA, TMA: NOT DETECTED

## 2021-04-26 LAB — RPR: RPR Ser Ql: NONREACTIVE

## 2021-05-08 ENCOUNTER — Ambulatory Visit: Payer: Self-pay | Admitting: Internal Medicine

## 2021-05-13 ENCOUNTER — Other Ambulatory Visit: Payer: Self-pay | Admitting: Internal Medicine

## 2021-05-13 ENCOUNTER — Ambulatory Visit: Payer: Self-pay | Admitting: Nurse Practitioner

## 2021-05-13 DIAGNOSIS — B20 Human immunodeficiency virus [HIV] disease: Secondary | ICD-10-CM

## 2021-05-13 NOTE — Telephone Encounter (Signed)
Follow up appt 4/4 ?

## 2021-05-15 ENCOUNTER — Ambulatory Visit: Payer: BC Managed Care – PPO | Admitting: Nurse Practitioner

## 2021-05-21 ENCOUNTER — Ambulatory Visit: Payer: Self-pay | Admitting: Internal Medicine

## 2021-05-31 ENCOUNTER — Telehealth: Payer: Self-pay

## 2021-05-31 NOTE — Telephone Encounter (Signed)
Patient called saying she needs a letter for work stating that she does not have TB. She had a normal CXR in 2018 - advised her she can access this result in [place], but that her employer may want something more recent. Advised her to discuss at upcoming appointment on 06/05/21. Patient verbalized understanding and has no further questions.  ? ?Beryle Flock, RN ? ?

## 2021-06-05 ENCOUNTER — Other Ambulatory Visit: Payer: Self-pay

## 2021-06-05 ENCOUNTER — Ambulatory Visit (INDEPENDENT_AMBULATORY_CARE_PROVIDER_SITE_OTHER): Payer: Self-pay | Admitting: Internal Medicine

## 2021-06-05 ENCOUNTER — Encounter: Payer: Self-pay | Admitting: Internal Medicine

## 2021-06-05 VITALS — BP 144/85 | HR 80 | Temp 98.6°F | Ht 66.0 in | Wt 246.0 lb

## 2021-06-05 DIAGNOSIS — B20 Human immunodeficiency virus [HIV] disease: Secondary | ICD-10-CM

## 2021-06-05 MED ORDER — CEPHALEXIN 500 MG PO CAPS: 500.0000 mg | ORAL_CAPSULE | Freq: Four times a day (QID) | ORAL | 0 refills | Status: DC

## 2021-06-05 NOTE — Progress Notes (Signed)
? ?  Subjective:  ? ? Patient ID: Paula Williams, female    DOB: March 08, 1970, 51 y.o.   MRN: [id number] ? ?HPI ?Here for follow up ? ? ?Review of Systems ? ?   ?Objective:  ? Physical Exam ? ? ? ? ?   ?Assessment & Plan:  ? ? ?

## 2021-12-10 ENCOUNTER — Other Ambulatory Visit: Payer: Self-pay

## 2021-12-10 ENCOUNTER — Ambulatory Visit (INDEPENDENT_AMBULATORY_CARE_PROVIDER_SITE_OTHER): Payer: Self-pay | Admitting: Internal Medicine

## 2021-12-10 ENCOUNTER — Encounter: Payer: Self-pay | Admitting: Internal Medicine

## 2021-12-10 ENCOUNTER — Ambulatory Visit: Payer: Self-pay

## 2021-12-10 VITALS — BP 170/96 | HR 79 | Temp 98.6°F | Wt 250.0 lb

## 2021-12-10 DIAGNOSIS — R600 Localized edema: Secondary | ICD-10-CM

## 2021-12-10 DIAGNOSIS — B20 Human immunodeficiency virus [HIV] disease: Secondary | ICD-10-CM

## 2021-12-10 DIAGNOSIS — Z23 Encounter for immunization: Secondary | ICD-10-CM

## 2021-12-10 DIAGNOSIS — I10 Essential (primary) hypertension: Secondary | ICD-10-CM

## 2021-12-10 MED ORDER — GENVOYA 150-150-200-10 MG PO TABS: 1.0000 | ORAL_TABLET | Freq: Every day | ORAL | 11 refills | Status: DC

## 2021-12-10 NOTE — Progress Notes (Signed)
   Subjective:    Patient ID: Paula Williams, female    DOB: [birth year], 51 y.o.   MRN: [id number]  HPI [place] is her for her routine follow up of HIV She continues on Genvoya and denies any missed doses.   She is concerned today about her weight and asking about Ozempic.   Both feet have been swelling recently.     Review of Systems  Constitutional:  Negative for fatigue.  Gastrointestinal:  Negative for diarrhea.  Skin:  Negative for rash.       Objective:   Physical Exam Eyes:     General: No scleral icterus. Pulmonary:     Effort: Pulmonary effort is normal.  Neurological:     General: No focal deficit present.     Mental Status: She is alert.  Psychiatric:        Mood and Affect: Mood normal.   SH: no tobacco        Assessment & Plan:

## 2021-12-10 NOTE — Assessment & Plan Note (Signed)
New problem and bilateral.  Her BP is elevated and possible heart-related.  I will check her creat today as well and electolytes.

## 2021-12-10 NOTE — Assessment & Plan Note (Signed)
She continues on Genvoya and has had no issues. Refills sent and she will continue with every 6 month follow up.

## 2021-12-10 NOTE — Assessment & Plan Note (Signed)
Noted today.  She has a cuff at home and will monitor at home and follow up with her PCP if it remains elevated.

## 2021-12-11 LAB — T-HELPER CELL (CD4) - (RCID CLINIC ONLY)
CD4 % Helper T Cell: 30 % — ABNORMAL LOW (ref 33–65)
CD4 T Cell Abs: 722 /uL (ref 400–1790)

## 2021-12-12 LAB — BASIC METABOLIC PANEL WITH GFR
BUN: 13 mg/dL (ref 7–25)
CO2: 27 mmol/L (ref 20–32)
Calcium: 9.1 mg/dL (ref 8.6–10.4)
Chloride: 104 mmol/L (ref 98–110)
Creat: 0.88 mg/dL (ref 0.50–1.03)
Glucose, Bld: 106 mg/dL — ABNORMAL HIGH (ref 65–99)
Potassium: 4 mmol/L (ref 3.5–5.3)
Sodium: 139 mmol/L (ref 135–146)
eGFR: 79 mL/min/{1.73_m2} (ref >=60)

## 2021-12-12 LAB — CBC WITH DIFFERENTIAL/PLATELET
Absolute Monocytes: 486 cells/uL (ref 200–950)
Basophils Absolute: 18 cells/uL (ref 0–200)
Basophils Relative: 0.3 %
Eosinophils Absolute: 198 cells/uL (ref 15–500)
Eosinophils Relative: 3.3 %
HCT: 39.3 % (ref 35.0–45.0)
Hemoglobin: 13.6 g/dL (ref 11.7–15.5)
Lymphs Abs: 2562 cells/uL (ref 850–3900)
MCH: 31.7 pg (ref 27.0–33.0)
MCHC: 34.6 g/dL (ref 32.0–36.0)
MCV: 91.6 fL (ref 80.0–100.0)
MPV: 11.3 fL (ref 7.5–12.5)
Monocytes Relative: 8.1 %
Neutro Abs: 2736 cells/uL (ref 1500–7800)
Neutrophils Relative %: 45.6 %
Platelets: 221 10*3/uL (ref 140–400)
RBC: 4.29 10*6/uL (ref 3.80–5.10)
RDW: 13 % (ref 11.0–15.0)
Total Lymphocyte: 42.7 %
WBC: 6 10*3/uL (ref 3.8–10.8)

## 2021-12-12 LAB — HIV-1 RNA QUANT-NO REFLEX-BLD
HIV 1 RNA Quant: 29 Copies/mL — ABNORMAL HIGH
HIV-1 RNA Quant, Log: 1.47 Log cps/mL — ABNORMAL HIGH

## 2022-03-27 ENCOUNTER — Other Ambulatory Visit: Payer: Self-pay | Admitting: Internal Medicine

## 2022-03-27 DIAGNOSIS — B20 Human immunodeficiency virus [HIV] disease: Secondary | ICD-10-CM

## 2022-06-03 ENCOUNTER — Ambulatory Visit (INDEPENDENT_AMBULATORY_CARE_PROVIDER_SITE_OTHER): Payer: Self-pay | Admitting: Internal Medicine

## 2022-06-03 ENCOUNTER — Encounter: Payer: Self-pay | Admitting: Internal Medicine

## 2022-06-03 ENCOUNTER — Other Ambulatory Visit: Payer: Self-pay

## 2022-06-03 VITALS — BP 132/80 | HR 92 | Resp 16 | Ht 66.0 in | Wt 244.0 lb

## 2022-06-03 DIAGNOSIS — B20 Human immunodeficiency virus [HIV] disease: Secondary | ICD-10-CM

## 2022-06-03 DIAGNOSIS — Z79899 Other long term (current) drug therapy: Secondary | ICD-10-CM

## 2022-06-03 DIAGNOSIS — Z113 Encounter for screening for infections with a predominantly sexual mode of transmission: Secondary | ICD-10-CM

## 2022-06-03 DIAGNOSIS — M25561 Pain in right knee: Secondary | ICD-10-CM | POA: Insufficient documentation

## 2022-06-03 DIAGNOSIS — Z23 Encounter for immunization: Secondary | ICD-10-CM

## 2022-06-03 NOTE — Assessment & Plan Note (Signed)
New problem of unknown cause.  Acute in nature.  I will refer her to sports medicine.

## 2022-06-03 NOTE — Assessment & Plan Note (Signed)
Will screen 

## 2022-06-03 NOTE — Progress Notes (Signed)
   Subjective:    Patient ID: Paula Williams, female    DOB: 03-Mar-1970, 52 y.o.   MRN: [id number]  HPI Daniel is here for follow up of HIV She continues on Genvoya and denies any missed doses.  She has no issues getting or taking her medication.  She recently noted significant right knee pain.  No trauma, no twisting or any inciting event that she recalls.  No swelling.     Review of Systems  Constitutional:  Negative for fatigue.  Gastrointestinal:  Negative for diarrhea.  Skin:  Negative for rash.       Objective:   Physical Exam Eyes:     General: No scleral icterus. Pulmonary:     Effort: Pulmonary effort is normal.  Skin:    Findings: No rash.  Neurological:     Mental Status: She is alert.   SH: no tobacco        Assessment & Plan:

## 2022-06-03 NOTE — Assessment & Plan Note (Signed)
She continues to do well with no concerns.  No issues with the medication and will check her labs today. Otherwise she can follow up in 1 year.   The Reprieve study results reviewed with her and offered to start a statin.  She will consider.

## 2022-06-04 LAB — URINE CYTOLOGY ANCILLARY ONLY
Chlamydia: NEGATIVE
Comment: NEGATIVE
Comment: NORMAL
Neisseria Gonorrhea: NEGATIVE

## 2022-06-04 LAB — T-HELPER CELL (CD4) - (RCID CLINIC ONLY)
CD4 % Helper T Cell: 31 % — ABNORMAL LOW (ref 33–65)
CD4 T Cell Abs: 758 /uL (ref 400–1790)

## 2022-06-07 LAB — COMPLETE METABOLIC PANEL WITH GFR
AG Ratio: 1.4 (calc) (ref 1.0–2.5)
ALT: 14 U/L (ref 6–29)
AST: 14 U/L (ref 10–35)
Albumin: 4.3 g/dL (ref 3.6–5.1)
Alkaline phosphatase (APISO): 71 U/L (ref 37–153)
BUN/Creatinine Ratio: 13 (calc) (ref 6–22)
BUN: 13 mg/dL (ref 7–25)
CO2: 27 mmol/L (ref 20–32)
Calcium: 9 mg/dL (ref 8.6–10.4)
Chloride: 105 mmol/L (ref 98–110)
Creat: 1.04 mg/dL — ABNORMAL HIGH (ref 0.50–1.03)
Globulin: 3.1 g/dL (calc) (ref 1.9–3.7)
Glucose, Bld: 129 mg/dL — ABNORMAL HIGH (ref 65–99)
Potassium: 3.9 mmol/L (ref 3.5–5.3)
Sodium: 141 mmol/L (ref 135–146)
Total Bilirubin: 0.4 mg/dL (ref 0.2–1.2)
Total Protein: 7.4 g/dL (ref 6.1–8.1)
eGFR: 64 mL/min/{1.73_m2} (ref >=60)

## 2022-06-07 LAB — CBC WITH DIFFERENTIAL/PLATELET
Absolute Monocytes: 408 cells/uL (ref 200–950)
Basophils Absolute: 30 cells/uL (ref 0–200)
Basophils Relative: 0.5 %
Eosinophils Absolute: 180 cells/uL (ref 15–500)
Eosinophils Relative: 3 %
HCT: 38.9 % (ref 35.0–45.0)
Hemoglobin: 13.2 g/dL (ref 11.7–15.5)
Lymphs Abs: 2778 cells/uL (ref 850–3900)
MCH: 30.8 pg (ref 27.0–33.0)
MCHC: 33.9 g/dL (ref 32.0–36.0)
MCV: 90.7 fL (ref 80.0–100.0)
MPV: 11.4 fL (ref 7.5–12.5)
Monocytes Relative: 6.8 %
Neutro Abs: 2604 cells/uL (ref 1500–7800)
Neutrophils Relative %: 43.4 %
Platelets: 209 10*3/uL (ref 140–400)
RBC: 4.29 10*6/uL (ref 3.80–5.10)
RDW: 12.9 % (ref 11.0–15.0)
Total Lymphocyte: 46.3 %
WBC: 6 10*3/uL (ref 3.8–10.8)

## 2022-06-07 LAB — LIPID PANEL
Cholesterol: 224 mg/dL — ABNORMAL HIGH (ref <200)
HDL: 40 mg/dL — ABNORMAL LOW (ref >=50)
LDL Cholesterol (Calc): 149 mg/dL (calc) — ABNORMAL HIGH
Non-HDL Cholesterol (Calc): 184 mg/dL (calc) — ABNORMAL HIGH (ref <130)
Total CHOL/HDL Ratio: 5.6 (calc) — ABNORMAL HIGH (ref <5.0)
Triglycerides: 201 mg/dL — ABNORMAL HIGH (ref <150)

## 2022-06-07 LAB — HIV-1 RNA QUANT-NO REFLEX-BLD
HIV 1 RNA Quant: 23 Copies/mL — ABNORMAL HIGH
HIV-1 RNA Quant, Log: 1.37 Log cps/mL — ABNORMAL HIGH

## 2022-06-07 LAB — RPR: RPR Ser Ql: NONREACTIVE

## 2022-06-10 ENCOUNTER — Ambulatory Visit (INDEPENDENT_AMBULATORY_CARE_PROVIDER_SITE_OTHER): Payer: Self-pay | Admitting: Sports Medicine

## 2022-06-10 VITALS — BP 138/86 | Ht 66.0 in | Wt 254.0 lb

## 2022-06-10 DIAGNOSIS — M25561 Pain in right knee: Secondary | ICD-10-CM

## 2022-06-10 DIAGNOSIS — G8929 Other chronic pain: Secondary | ICD-10-CM

## 2022-06-10 NOTE — Progress Notes (Signed)
   Subjective:    Patient ID: Paula Williams, female    DOB: May 01, 1970, 52 y.o.   MRN: [id number]  HPI chief complaint: Right knee pain  Very pleasant 52 year old female presents today with 1 year of intermittent right knee pain.  No known trauma.  Pain will radiate into the calf and at times into the ankle.  Is most noticeable with driving as well as with first steps with walking.  She denies any swelling.  No mechanical symptoms.  X-rays of her knee done in 2020 showed some minimal patellofemoral changes as well as a small knee effusion.  No prior knee surgeries.  Past medical history reviewed Medications reviewed Allergies reviewed    Review of Systems As above    Objective:   Physical Exam  Well-developed, well-nourished.  No acute distress  Right knee: Range of motion is 0 to approximately 100 degrees.  No obvious effusion.  No tenderness to palpation along medial or lateral joint lines but there is pain with McMurray's.  Knee is stable to ligamentous exam.  Negative Homans.  No calf swelling.  No palpable cyst in the popliteal fossa.  Neurovascularly intact distally.      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  She would also like [place] to get an x-ray of her lumbar spine.  I will message her through MyChart with those results when available.  In the meantime, I had initially plan on providing an NSAID but there are contraindications with some of her current medications.  Instead, I will recommend extra strength Tylenol twice daily for 7 days then as needed.  We have also recommended compression for the right knee when active either in the form of an Ace wrap or a compression sleeve.  She will start isometric quad exercises as well as quad sets.  We will delineate further workup and treatment based on her response to today's treatment as well as x-ray findings.  This note was  dictated using Dragon naturally speaking software and may contain errors in syntax, spelling, or content which have not been identified prior to signing this note.

## 2022-06-10 NOTE — Patient Instructions (Signed)
Get x-rays when you leave today. The medication we wanted to prescribe interacts with another medication you are taking. Take 2 extra strength tylenol twice daily for 5 days, then take as needed. Use a compression sleeve/ace wrap on knee when you are active. Do the exercises we gave you. We will message you through mychart with xray results and next steps.

## 2022-06-12 ENCOUNTER — Ambulatory Visit
Admission: RE | Admit: 2022-06-12 | Discharge: 2022-06-12 | Disposition: A | Discharge status: Home / Self Care | Payer: Self-pay | Source: Ambulatory Visit | Attending: Sports Medicine | Admitting: Sports Medicine

## 2022-06-12 DIAGNOSIS — G8929 Other chronic pain: Secondary | ICD-10-CM

## 2022-06-16 ENCOUNTER — Encounter: Payer: Self-pay | Admitting: Sports Medicine

## 2022-08-13 ENCOUNTER — Telehealth (INDEPENDENT_AMBULATORY_CARE_PROVIDER_SITE_OTHER): Payer: Self-pay

## 2022-08-13 DIAGNOSIS — U071 COVID-19: Secondary | ICD-10-CM | POA: Insufficient documentation

## 2022-08-13 MED ORDER — BENZONATATE 100 MG PO CAPS: 100.0000 mg | ORAL_CAPSULE | Freq: Four times a day (QID) | ORAL | 0 refills | Status: DC | PRN

## 2022-08-13 MED ORDER — NIRMATRELVIR/RITONAVIR (PAXLOVID)TABLET: 3.0000 | ORAL_TABLET | Freq: Two times a day (BID) | ORAL | 0 refills | Status: DC

## 2022-08-13 NOTE — Telephone Encounter (Signed)
Please see the MyChart message reply(ies) for my assessment and plan.    This patient gave consent for this Medical Advice Message and is aware that it may result in a bill to [employer], as well as the possibility of receiving a bill for a co-payment or deductible. They are an established patient, but are not seeking medical advice exclusively about a problem treated during an in person or video visit in the last seven days. I did not recommend an in person or video visit within seven days of my reply.    I spent a total of 6 minutes cumulative time within 7 days through [employer].  Problem List Items Addressed This Visit       Unprioritized   Acute COVID-19 - Primary   Relevant Medications   nirmatrelvir/ritonavir (PAXLOVID) 20 x 150 MG & 10 x 100MG  TABS     Rexene Alberts, NP

## 2022-08-13 NOTE — Addendum Note (Signed)
Addended by: Blanchard Kelch on: 08/13/2022 12:37 PM   Modules accepted: Orders

## 2022-08-13 NOTE — Telephone Encounter (Signed)
  Patient reports positive at home COVID test 2 days ago.   Patient states she has had fever as high as 100.4, headaches, and diarrhea. Patient requesting paxlovid be sent to her pharmacy. Routing to provider to make aware.   Advised patient the following:  Stay home and avoid contact with others Avoid travel on public transportation if possible  Seek emergency care if she starts to experience any shortness of breath.  Patient verbalized understanding.   Valarie Cones, LPN

## 2022-08-14 ENCOUNTER — Other Ambulatory Visit: Payer: Self-pay | Admitting: Pharmacist

## 2022-08-14 ENCOUNTER — Other Ambulatory Visit (HOSPITAL_COMMUNITY): Payer: Self-pay

## 2022-08-14 ENCOUNTER — Telehealth: Payer: Self-pay

## 2022-08-14 DIAGNOSIS — U071 COVID-19: Secondary | ICD-10-CM

## 2022-08-14 MED ORDER — NIRMATRELVIR/RITONAVIR (PAXLOVID)TABLET
3.0000 | ORAL_TABLET | Freq: Two times a day (BID) | ORAL | 0 refills | Status: AC
Start: 2022-08-14 — End: 2022-08-19
  Filled 2022-08-14: qty 30, 5d supply, fill #0

## 2022-08-14 NOTE — Telephone Encounter (Signed)
We got her a voucher. Setting it up now.

## 2022-08-14 NOTE — Telephone Encounter (Signed)
[facility] Patient Advocate Encounter  Completed and sent [employer] Assistance application for Paxlovid for this patient who is uninsured.    Patient is approved 08/14/22 through 02/17/23.         Clearance Coots, CPhT Specialty Pharmacy Patient [facility] Phone: [phone] Fax:  [phone]

## 2022-08-15 ENCOUNTER — Other Ambulatory Visit (HOSPITAL_COMMUNITY): Payer: Self-pay

## 2022-10-14 ENCOUNTER — Ambulatory Visit: Payer: Self-pay

## 2022-10-14 ENCOUNTER — Other Ambulatory Visit: Payer: Self-pay

## 2022-11-11 ENCOUNTER — Other Ambulatory Visit (HOSPITAL_COMMUNITY): Payer: Self-pay

## 2022-11-11 ENCOUNTER — Other Ambulatory Visit: Payer: Self-pay | Admitting: Internal Medicine

## 2022-11-11 DIAGNOSIS — B20 Human immunodeficiency virus [HIV] disease: Secondary | ICD-10-CM

## 2022-12-05 ENCOUNTER — Emergency Department (HOSPITAL_BASED_OUTPATIENT_CLINIC_OR_DEPARTMENT_OTHER): Payer: Medicaid Other | Admitting: Radiology

## 2022-12-05 ENCOUNTER — Other Ambulatory Visit: Payer: Self-pay

## 2022-12-05 ENCOUNTER — Encounter (HOSPITAL_BASED_OUTPATIENT_CLINIC_OR_DEPARTMENT_OTHER): Payer: Self-pay | Admitting: Emergency Medicine

## 2022-12-05 ENCOUNTER — Emergency Department (HOSPITAL_BASED_OUTPATIENT_CLINIC_OR_DEPARTMENT_OTHER)
Admission: EM | Admit: 2022-12-05 | Discharge: 2022-12-05 | Disposition: A | Discharge status: Home / Self Care | Payer: Medicaid Other | Attending: Emergency Medicine | Admitting: Emergency Medicine

## 2022-12-05 DIAGNOSIS — R030 Elevated blood-pressure reading, without diagnosis of hypertension: Secondary | ICD-10-CM | POA: Diagnosis not present

## 2022-12-05 DIAGNOSIS — R0781 Pleurodynia: Secondary | ICD-10-CM | POA: Insufficient documentation

## 2022-12-05 LAB — BASIC METABOLIC PANEL
Anion gap: 7 (ref 5–15)
BUN: 18 mg/dL (ref 6–20)
CO2: 28 mmol/L (ref 22–32)
Calcium: 9.2 mg/dL (ref 8.9–10.3)
Chloride: 105 mmol/L (ref 98–111)
Creatinine, Ser: 1.09 mg/dL — ABNORMAL HIGH (ref 0.44–1.00)
GFR, Estimated: >60 mL/min (ref >60)
Glucose, Bld: 117 mg/dL — ABNORMAL HIGH (ref 70–99)
Potassium: 4.1 mmol/L (ref 3.5–5.1)
Sodium: 140 mmol/L (ref 135–145)

## 2022-12-05 LAB — CBC
HCT: 42.2 % (ref 36.0–46.0)
Hemoglobin: 14.3 g/dL (ref 12.0–15.0)
MCH: 31.4 pg (ref 26.0–34.0)
MCHC: 33.9 g/dL (ref 30.0–36.0)
MCV: 92.5 fL (ref 80.0–100.0)
Platelets: 211 10*3/uL (ref 150–400)
RBC: 4.56 MIL/uL (ref 3.87–5.11)
RDW: 14 % (ref 11.5–15.5)
WBC: 6.4 10*3/uL (ref 4.0–10.5)
nRBC: 0 % (ref 0.0–0.2)

## 2022-12-05 LAB — PREGNANCY, URINE: Preg Test, Ur: NEGATIVE

## 2022-12-05 LAB — TROPONIN I (HIGH SENSITIVITY)
Troponin I (High Sensitivity): 6 ng/L (ref <18)
Troponin I (High Sensitivity): 6 ng/L (ref <18)

## 2022-12-05 LAB — D-DIMER, QUANTITATIVE: D-Dimer, Quant: 0.28 ug{FEU}/mL (ref 0.00–0.50)

## 2022-12-05 LAB — LIPASE, BLOOD: Lipase: 18 U/L (ref 11–51)

## 2022-12-05 MED ORDER — CELECOXIB 200 MG PO CAPS: 200.0000 mg | ORAL_CAPSULE | Freq: Two times a day (BID) | ORAL | 0 refills | Status: DC

## 2022-12-05 MED ORDER — LIDOCAINE 5 % EX PTCH
1.0000 | MEDICATED_PATCH | CUTANEOUS | Status: DC
  Administered 2022-12-05: 1 via TRANSDERMAL
  Filled 2022-12-05: qty 1

## 2022-12-05 MED ORDER — MORPHINE SULFATE (PF) 4 MG/ML IV SOLN
4.0000 mg | Freq: Once | INTRAVENOUS | Status: AC
  Administered 2022-12-05: 4 mg via INTRAVENOUS
  Filled 2022-12-05: qty 1

## 2022-12-05 MED ORDER — ONDANSETRON HCL 4 MG/2ML IJ SOLN
4.0000 mg | Freq: Once | INTRAMUSCULAR | Status: AC
  Administered 2022-12-05: 4 mg via INTRAVENOUS
  Filled 2022-12-05: qty 2

## 2022-12-05 MED ORDER — ACETAMINOPHEN 325 MG PO TABS
650.0000 mg | ORAL_TABLET | Freq: Once | ORAL | Status: DC
  Filled 2022-12-05: qty 2

## 2022-12-05 NOTE — ED Triage Notes (Signed)
Sharp chest pain under left breast coming and going. Worse in certain positions Some nausea Was seen at [facility] BP 185/145 advised to be evaled in ed

## 2022-12-05 NOTE — ED Provider Notes (Signed)
[employer] EMERGENCY DEPARTMENT AT [facility] Provider Note   CSN: [id number] Arrival date & time: 12/05/22  1141     History  Chief Complaint  Patient presents with   Chest Pain    Paula Williams is a 52 y.o. female who presents emergency department with a chief complaint of chest pain.  Patient had onset of left-sided chest pain which she describes as sharp, worse with breathing, located on her left breast.  It radiates into her back.  She has never had anything like this before.  She denies nausea, vomiting or diaphoresis.  She states she thinks it might be pneumonia.  She has not had a cough or fever.  Her blood pressure is elevated today.  She states she has had previously documented elevated blood pressures in the past but does not take blood pressure medication.  She has a brother had a heart attack at about the same age, 54 years old.  She denies high Konrad Dolores all or smoking and is not diabetic.   Chest Pain      Home Medications Prior to Admission medications   Medication Sig Start Date End Date Taking? Authorizing Provider  albuterol (PROVENTIL HFA;VENTOLIN HFA) 108 (90 BASE) MCG/ACT inhaler Inhale 2 puffs into the lungs every 6 (six) hours as needed for wheezing or shortness of breath. Patient taking differently: Inhale into the lungs every 6 (six) hours as needed for wheezing or shortness of breath. 08/23/14   Henrietta Hoover, NP  Ascorbic Acid (VITAMIN C PO) Take 1 tablet by mouth daily. Reported on 06/19/2015    [provider]  beclomethasone (QVAR) 80 MCG/ACT inhaler Inhale 1 puff into the lungs 2 (two) times daily. 11/02/19   Comer, Belia Heman, MD  benzonatate (TESSALON PERLES) 100 MG capsule Take 1 capsule (100 mg total) by mouth every 6 (six) hours as needed for cough. 08/13/22 08/13/23  Blanchard Kelch, NP  GENVOYA 150-150-200-10 MG TABS tablet TAKE 1 TABLET BY MOUTH DAILY WITH BREAKFAST 11/11/22   Comer, Belia Heman, MD  ipratropium (ATROVENT)  0.03 % nasal spray Place 2 sprays into both nostrils 2 (two) times daily. 03/18/17   Bing Neighbors, NP  methocarbamol (ROBAXIN) 500 MG tablet methocarbamol 500 mg tablet  Take 1 tablet 3 times a day by oral route as needed. Patient not taking: Reported on 01/25/2021    [provider]  omega-3 acid ethyl esters (LOVAZA) 1 g capsule Take 1 capsule (1 g total) by mouth 2 (two) times daily. 10/17/20 12/16/20  Orion Crook I, NP  Pitavastatin Calcium 1 MG TABS Take 1 tablet (1 mg total) by mouth daily. Patient not taking: Reported on 11/07/2020 10/17/20 01/15/21  Orion Crook I, NP  VITAMIN A PO Take 1 capsule by mouth daily.  Patient not taking: Reported on 01/25/2021    [provider]      Allergies    Ampicillin, Tetracyclines & related, Toradol [ketorolac tromethamine], Clindamycin/lincomycin, Sustiva [efavirenz], and Tramadol    Review of Systems   Review of Systems  Cardiovascular:  Positive for chest pain.    Physical Exam Updated Vital Signs BP (!) 164/76   Pulse 71   Temp 98.3 F (36.8 C)   Resp 15   Ht 5\' 6"  (1.676 m)   Wt 110.2 kg   SpO2 98%   BMI 39.22 kg/m  Physical Exam Vitals and nursing note reviewed.  Constitutional:      General: She is not in acute distress.  Appearance: She is well-developed. She is not diaphoretic.  HENT:     Head: Normocephalic and atraumatic.     Right Ear: External ear normal.     Left Ear: External ear normal.     Nose: Nose normal.     Mouth/Throat:     Mouth: Mucous membranes are moist.  Eyes:     General: No scleral icterus.    Conjunctiva/sclera: Conjunctivae normal.  Cardiovascular:     Rate and Rhythm: Normal rate and regular rhythm.     Heart sounds: Normal heart sounds. No murmur heard.    No friction rub. No gallop.  Pulmonary:     Effort: Pulmonary effort is normal. No respiratory distress.     Breath sounds: Normal breath sounds. No wheezing.  Abdominal:     General: Bowel sounds are  normal. There is no distension.     Palpations: Abdomen is soft. There is no mass.     Tenderness: There is no abdominal tenderness. There is no guarding.  Musculoskeletal:     Cervical back: Normal range of motion.  Skin:    General: Skin is warm and dry.  Neurological:     Mental Status: She is alert and oriented to person, place, and time.  Psychiatric:        Behavior: Behavior normal.     ED Results / Procedures / Treatments   Labs (all labs ordered are listed, but only abnormal results are displayed) Labs Reviewed  BASIC METABOLIC PANEL - Abnormal; Notable for the following components:      Result Value   Glucose, Bld 117 (*)    Creatinine, Ser 1.09 (*)    All other components within normal limits  CBC  PREGNANCY, URINE  LIPASE, BLOOD  D-DIMER, QUANTITATIVE  TROPONIN I (HIGH SENSITIVITY)  TROPONIN I (HIGH SENSITIVITY)    EKG EKG Interpretation Date/Time:  Friday December 05 2022 12:04:37 EDT Ventricular Rate:  73 PR Interval:  180 QRS Duration:  99 QT Interval:  382 QTC Calculation: 421 R Axis:   37  Text Interpretation: Sinus rhythm Anteroseptal infarct, old NOnspecific T wave inversions seen on prior tracing  Confirmed by Alvester Chou [phone]) on 12/05/2022 12:42:18 PM  Radiology DG Chest 2 View  Result Date: 12/05/2022 CLINICAL DATA:  chest pain. EXAM: CHEST - 2 VIEW COMPARISON:  09/27/2016. FINDINGS: Bilateral lung fields are clear. Bilateral costophrenic angles are clear. Stable mildly enlarged cardio-mediastinal silhouette. No acute osseous abnormalities. The soft tissues are within normal limits. IMPRESSION: 1. No active cardiopulmonary disease. 2. Stable mild cardiomegaly. Electronically Signed   By: Jules Schick M.D.   On: 12/05/2022 14:46    Procedures Procedures    Medications Ordered in ED Medications  acetaminophen (TYLENOL) tablet 650 mg (650 mg Oral Not Given 12/05/22 1440)  lidocaine (LIDODERM) 5 % 1 patch (1 patch Transdermal Patch  Applied 12/05/22 1416)  morphine (PF) 4 MG/ML injection 4 mg (4 mg Intravenous Given 12/05/22 1437)  ondansetron (ZOFRAN) injection 4 mg (4 mg Intravenous Given 12/05/22 1437)    ED Course/ Medical Decision Making/ A&P Clinical Course as of 12/05/22 1554  Fri Dec 05, 2022  1354 CBC [AH]  1354 Lipase, blood [AH]  1354 Troponin I (High Sensitivity) [AH]  1552 Troponin I (High Sensitivity) [AH]  1552 Lipase, blood [AH]  1552 D-dimer, quantitative [AH]  1552 Pregnancy, urine [AH]  1552 Basic metabolic panel(!) [AH]  1552 CBC [AH]  1552 DG Chest 2 View [AH]  1552 Visualized and interpreted two-view  chest x-ray which shows no acute findings [AH]  1553 ED EKG EKG shows sinus rhythm with nonspecific T wave abnormalities [AH]    Clinical Course User Index [AH] Arthor Captain, PA-C             HEART Score: 3                    Medical Decision Making Given the large differential diagnosis for Heydi Kelso, the decision making in this case is of high complexity.  After evaluating all of the data points in this case, the presentation of Kikue Cazenave is NOT consistent with Acute Coronary Syndrome (ACS) and/or myocardial ischemia, pulmonary embolism, aortic dissection; Borhaave's, significant arrythmia, pneumothorax, cardiac tamponade, or other emergent cardiopulmonary condition.  Further, the presentation of Nahiara Picker is NOT consistent with pericarditis, myocarditis, cholecystitis, pancreatitis, mediastinitis, endocarditis, new valvular disease.  Additionally, the presentation of Melanni Mukurazhizhais NOT consistent with flail chest, cardiac contusion, ARDS, or significant intra-thoracic or intra-abdominal bleeding.  Moreover, this presentation is NOT consistent with pneumonia, sepsis, or pyelonephritis.  The patient has a HEART Score: 3    Strict return and follow-up precautions have been given by me personally or by detailed written instruction given  verbally by nursing staff using the teach back method to the patient/family/caregiver(s).  Data Reviewed/Counseling: I have reviewed the patient's vital signs, nursing notes, and other relevant tests/information. I had a detailed discussion regarding the historical points, exam findings, and any diagnostic results supporting the discharge diagnosis. I also discussed the need for outpatient follow-up and the need to return to the ED if symptoms worsen or if there are any questions or concerns that arise at home.    Amount and/or Complexity of Data Reviewed Labs: ordered. Decision-making details documented in ED Course. Radiology: ordered. Decision-making details documented in ED Course. ECG/medicine tests:  Decision-making details documented in ED Course.  Risk OTC drugs. Prescription drug management.           Final Clinical Impression(s) / ED Diagnoses Final diagnoses:  Pleuritic chest pain  Elevated blood pressure reading    Rx / DC Orders ED Discharge Orders     None         Arthor Captain, PA-C 12/05/22 1609    Terald Sleeper, MD 12/05/22 1735

## 2022-12-05 NOTE — Discharge Instructions (Addendum)

## 2022-12-08 NOTE — Plan of Care (Signed)
[facility] Tonsillectomy/Adenoidectomy, Postoperative PEDS care plan entered in error.

## 2023-05-25 NOTE — Progress Notes (Unsigned)
 HPI: Paula Williams is a 53 y.o. female who presents to the [facility] pharmacy clinic for HIV follow-up.  Patient Active Problem List   Diagnosis Date Noted   Acute COVID-19 08/13/2022   Knee pain, right 06/03/2022   Encounter for long-term (current) use of medications 06/03/2022   Pedal edema 12/10/2021   High blood pressure 12/10/2021   Excessive daytime sleepiness 01/30/2020   RLS (restless legs syndrome) 01/30/2020   Snoring 01/30/2020   Insomnia 04/27/2019   Referred otalgia of right ear 01/11/2019   Pain in bone of jaw 01/04/2019   Hyperglycemia 01/04/2019   Class 2 severe obesity due to excess calories with serious comorbidity and body mass index (BMI) of 39.0 to 39.9 in adult [facility]) 01/04/2019   Pain in left knee 09/21/2018   Hyperthyroidism 08/12/2017   Healthcare maintenance 10/14/2016   Rash and nonspecific skin eruption 06/12/2016   Osteoarthritis of both knees 06/19/2015   Right hip pain 01/02/2015   History of dizziness 11/13/2014   Prediabetes 11/13/2014   Screening examination for venereal disease 07/25/2014   Chronic back pain greater than 3 months duration 02/23/2014   Decreased range of motion of intervertebral discs of lumbar spine 12/14/2013   HIV disease (HCC) 08/19/2012   Anxiety state 08/19/2012   History of BCG vaccination 08/11/2012   Fibroids 08/03/2012   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   ASCORBIC ACID (VITAMIN C PO)    Take 1 tablet by mouth daily. Reported on 06/19/2015   BECLOMETHASONE (QVAR) 80 MCG/ACT INHALER     Inhale 1 puff into the lungs 2 (two) times daily.   BENZONATATE (TESSALON PERLES) 100 MG CAPSULE    Take 1 capsule (100 mg total) by mouth every 6 (six) hours as needed for cough.   CELECOXIB (CELEBREX) 200 MG CAPSULE    Take 1 capsule (200 mg total) by mouth 2 (two) times daily.   GENVOYA 150-150-200-10 MG TABS TABLET    TAKE 1 TABLET BY MOUTH DAILY WITH BREAKFAST   IPRATROPIUM (ATROVENT) 0.03 % NASAL SPRAY    Place 2 sprays into both nostrils 2 (two) times daily.   METHOCARBAMOL (ROBAXIN) 500 MG TABLET    methocarbamol 500 mg tablet  Take 1 tablet 3 times a day by oral route as needed.   OMEGA-3 ACID ETHYL ESTERS (LOVAZA) 1 G CAPSULE    Take 1 capsule (1 g total) by mouth 2 (two) times daily.   PITAVASTATIN CALCIUM 1 MG TABS    Take 1 tablet (1 mg total) by mouth daily.   VITAMIN A PO    Take 1 capsule by mouth daily.   Modified Medications   No medications on file  Discontinued Medications   No medications on file    Labs: Lab Results  Component Value Date   HIV1RNAQUANT 23 (H) 06/03/2022   HIV1RNAQUANT 29 (H) 12/10/2021   HIV1RNAQUANT Not Detected 04/24/2021   CD4TABS 758 06/03/2022   CD4TABS 722 12/10/2021   CD4TABS 710 10/25/2020    RPR and STI Lab Results  Component Value Date  LABRPR NON-REACTIVE 06/03/2022   LABRPR NON-REACTIVE 04/24/2021   LABRPR NON-REACTIVE 10/25/2020   LABRPR NON-REACTIVE 10/21/2019   LABRPR NON-REACTIVE 04/27/2019    STI Results GC CT  06/03/2022 10:44 AM Negative  Negative   10/25/2020  2:44 PM Negative  Negative   10/21/2019 10:59 AM Negative  Negative   04/27/2019  9:53 AM Negative  Negative   11/03/2017 12:00 AM Negative  Negative   11/16/2014 12:00 AM Negative  Negative     Hepatitis B Lab Results  Component Value Date   HEPBSAB REACTIVE (A) 08/10/2012   HEPBSAG NEGATIVE 08/10/2012   HEPBCAB NEG 08/10/2012   Hepatitis C No results found for: "HEPCAB", "HCVRNAPCRQN" Hepatitis A Lab Results  Component Value Date   HAV POS  (A) 08/10/2012   Lipids: Lab Results  Component Value Date   CHOL 224 (H) 06/03/2022   TRIG 201 (H) 06/03/2022   HDL 40 (L) 06/03/2022   CHOLHDL 5.6 (H) 06/03/2022   VLDL 31 (H) 05/28/2016   LDLCALC 149 (H) 06/03/2022    Current HIV Regimen: Genvoya  Assessment: Paula Williams presents today for annual follow-up for HIV on Genvoya. No issues filling or taking Genvoya.  Last HIV RNA was undetectable in April 2024. CD4 count 758 at same visit. At last annual visit with Dr Luciana Axe, discussed statin in HIV given Repreive trial. LDL 149 April 2024. Previously on pitavastatin 1 mg, but reported not taking. Risks and benefits of statin therapy in elevated cholesterol, HIV diagnosis discussed with patient.   Patient expressed increased allergy symptoms including nasal congestion and eye irritation, and is requesting medication for this.  Plan: - HIV RNA today - CD4 today - Annual CMP today  - Annual lipid panel today, address statin prescription with patient with results - Providing refills for allergy medications cetirizine, beclemethasone nasal spray   Patient to schedule visit with new provider in a few months with front staff at checkout.  Haze Boyden PharmD Candidate

## 2023-05-26 ENCOUNTER — Other Ambulatory Visit (HOSPITAL_COMMUNITY)
Admission: RE | Admit: 2023-05-26 | Discharge: 2023-05-26 | Disposition: A | Discharge status: Home / Self Care | Source: Ambulatory Visit | Attending: Internal Medicine | Admitting: Internal Medicine

## 2023-05-26 ENCOUNTER — Ambulatory Visit (INDEPENDENT_AMBULATORY_CARE_PROVIDER_SITE_OTHER): Payer: Medicaid Other | Admitting: Pharmacist

## 2023-05-26 ENCOUNTER — Other Ambulatory Visit: Payer: Self-pay

## 2023-05-26 ENCOUNTER — Telehealth: Payer: Self-pay

## 2023-05-26 DIAGNOSIS — J302 Other seasonal allergic rhinitis: Secondary | ICD-10-CM | POA: Diagnosis not present

## 2023-05-26 DIAGNOSIS — B20 Human immunodeficiency virus [HIV] disease: Secondary | ICD-10-CM | POA: Diagnosis present

## 2023-05-26 MED ORDER — BECONASE AQ 42 MCG/SPRAY NA SUSP
1.0000 | Freq: Two times a day (BID) | NASAL | 2 refills | Status: DC
Start: 2023-05-26 — End: 2023-08-25

## 2023-05-26 MED ORDER — GENVOYA 150-150-200-10 MG PO TABS: 1.0000 | ORAL_TABLET | Freq: Every day | ORAL | 5 refills | Status: DC

## 2023-05-26 MED ORDER — CETIRIZINE HCL 10 MG PO TABS
10.0000 mg | ORAL_TABLET | Freq: Every day | ORAL | 2 refills | Status: DC
Start: 2023-05-26 — End: 2023-08-25

## 2023-05-26 NOTE — Telephone Encounter (Signed)
 Pt arrived for f/u visit - Pharm team completed f/u for Dr. Luciana Axe pt till we are able to schedule for next f/u needed. As instructed pt will need a 4-6 month f/u with the new assigned provider due to the specific medication refills.  Offered pt Aug 2025 appt with Dr. Thedore Mins, but pt refused and stated that she will need an earlier f/u due to labs were just completed today, so she will need to be seen by a provider within the next couple of weeks to month.  Pharm team has prev explained to pt that she will be establishing with a new provider and they will be setting her for a 30 min visit, so this will not be able to completed at the end of the day. She stated that she is not a new pt and still continues to ask for a latest appt.   I was able to assist with this scheduling need and added her as requested for an earlier appt, but I did stay with Pharmacies format of establishing with a new provider with a 30 min at this time until further advisement. The latest we could compromise due to the length would be 3:45pm and she did agree at this time.

## 2023-05-27 LAB — T-HELPER CELLS (CD4) COUNT (NOT AT ARMC)
CD4 % Helper T Cell: 29 % — ABNORMAL LOW (ref 33–65)
CD4 T Cell Abs: 663 /uL (ref 400–1790)

## 2023-05-27 LAB — URINE CYTOLOGY ANCILLARY ONLY
Chlamydia: NEGATIVE
Comment: NEGATIVE
Comment: NORMAL
Neisseria Gonorrhea: NEGATIVE

## 2023-05-29 LAB — HIV-1 RNA QUANT-NO REFLEX-BLD
HIV 1 RNA Quant: NOT DETECTED {copies}/mL
HIV-1 RNA Quant, Log: NOT DETECTED {Log_copies}/mL

## 2023-05-29 LAB — COMPREHENSIVE METABOLIC PANEL WITH GFR
AG Ratio: 1.3 (calc) (ref 1.0–2.5)
ALT: 15 U/L (ref 6–29)
AST: 16 U/L (ref 10–35)
Albumin: 4.4 g/dL (ref 3.6–5.1)
Alkaline phosphatase (APISO): 72 U/L (ref 37–153)
BUN: 13 mg/dL (ref 7–25)
CO2: 24 mmol/L (ref 20–32)
Calcium: 9.1 mg/dL (ref 8.6–10.4)
Chloride: 102 mmol/L (ref 98–110)
Creat: 1.03 mg/dL (ref 0.50–1.03)
Globulin: 3.3 g/dL (ref 1.9–3.7)
Glucose, Bld: 114 mg/dL — ABNORMAL HIGH (ref 65–99)
Potassium: 4.4 mmol/L (ref 3.5–5.3)
Sodium: 140 mmol/L (ref 135–146)
Total Bilirubin: 0.3 mg/dL (ref 0.2–1.2)
Total Protein: 7.7 g/dL (ref 6.1–8.1)
eGFR: 64 mL/min/{1.73_m2} (ref >=60)

## 2023-05-29 LAB — LIPID PANEL
Cholesterol: 229 mg/dL — ABNORMAL HIGH (ref <200)
HDL: 46 mg/dL — ABNORMAL LOW (ref >=50)
LDL Cholesterol (Calc): 151 mg/dL — ABNORMAL HIGH
Non-HDL Cholesterol (Calc): 183 mg/dL — ABNORMAL HIGH (ref <130)
Total CHOL/HDL Ratio: 5 (calc) — ABNORMAL HIGH (ref <5.0)
Triglycerides: 182 mg/dL — ABNORMAL HIGH (ref <150)

## 2023-05-29 LAB — RPR: RPR Ser Ql: NONREACTIVE

## 2023-06-03 ENCOUNTER — Ambulatory Visit: Payer: Self-pay | Admitting: Internal Medicine

## 2023-06-11 ENCOUNTER — Other Ambulatory Visit: Payer: Self-pay | Admitting: Internal Medicine

## 2023-06-11 DIAGNOSIS — B20 Human immunodeficiency virus [HIV] disease: Secondary | ICD-10-CM

## 2023-06-17 ENCOUNTER — Ambulatory Visit: Admitting: Internal Medicine

## 2023-06-18 ENCOUNTER — Other Ambulatory Visit: Payer: Self-pay | Admitting: Internal Medicine

## 2023-06-18 DIAGNOSIS — B20 Human immunodeficiency virus [HIV] disease: Secondary | ICD-10-CM

## 2023-06-30 NOTE — Progress Notes (Signed)
 The 10-year ASCVD risk score (Arnett DK, et al., 2019) is: 10.5%   Values used to calculate the score:     Age: 53 years     Sex: Female     Is Non-Hispanic African American: Yes     Diabetic: No     Tobacco smoker: No     Systolic Blood Pressure: 167 mmHg     Is BP treated: No     HDL Cholesterol: 46 mg/dL     Total Cholesterol: 229 mg/dL  Arlon Bergamo, BSN, RN

## 2023-07-08 ENCOUNTER — Ambulatory Visit: Admitting: Internal Medicine

## 2023-08-25 ENCOUNTER — Ambulatory Visit: Admitting: Internal Medicine

## 2023-08-25 ENCOUNTER — Encounter: Payer: Self-pay | Admitting: Internal Medicine

## 2023-08-25 ENCOUNTER — Other Ambulatory Visit: Payer: Self-pay

## 2023-08-25 VITALS — BP 157/89 | HR 85 | Temp 97.8°F | Ht 66.0 in | Wt 241.0 lb

## 2023-08-25 DIAGNOSIS — B20 Human immunodeficiency virus [HIV] disease: Secondary | ICD-10-CM

## 2023-08-25 DIAGNOSIS — I2510 Atherosclerotic heart disease of native coronary artery without angina pectoris: Secondary | ICD-10-CM | POA: Diagnosis not present

## 2023-08-25 DIAGNOSIS — Z113 Encounter for screening for infections with a predominantly sexual mode of transmission: Secondary | ICD-10-CM

## 2023-08-25 MED ORDER — BICTEGRAVIR-EMTRICITAB-TENOFOV 50-200-25 MG PO TABS: 1.0000 | ORAL_TABLET | Freq: Every day | ORAL | 11 refills | Status: AC

## 2023-08-25 MED ORDER — ATORVASTATIN CALCIUM 40 MG PO TABS: 40.0000 mg | ORAL_TABLET | Freq: Every day | ORAL | 11 refills | Status: AC

## 2023-08-25 MED ORDER — BICTEGRAVIR-EMTRICITAB-TENOFOV 50-200-25 MG PO TABS: 1.0000 | ORAL_TABLET | Freq: Every day | ORAL | 11 refills | Status: DC

## 2023-08-25 MED ORDER — CETIRIZINE HCL 10 MG PO TABS: 10.0000 mg | ORAL_TABLET | Freq: Every day | ORAL | 5 refills | Status: AC

## 2023-08-25 MED ORDER — FLUTICASONE PROPIONATE 50 MCG/ACT NA SUSP: 1.0000 | Freq: Every day | NASAL | 5 refills | Status: AC

## 2023-08-25 NOTE — Patient Instructions (Addendum)
 You are doing well on genvoya   Today will switch to biktarvy as it is better   Your weight gain there is on going research now to address this; I will update you once we know of some safe/good way to treat (ozempic is available but I don't believe we know everything about its safety profile yet at this time; fen-phen is also available but not good for your heart/lung)   New medication: Lipitor 40 mg -- take once a day for reduction of risk cad/stroke. Make sure you stop genovya and start biktarvy when you start lipitor    See me in 3 months to recheck kidney and liver given that we started new medications    Make sure you see your primary care once a year to get women health/cancer screeing done    Next visit will also test for hepatitis c presence (previous testing show you were exposed to it)

## 2023-08-25 NOTE — Progress Notes (Signed)
 Subjective:    Patient ID: Paula Williams Doctor, female    DOB: 29-Dec-1970, 53 y.o.   MRN: [id number]  HPI Paula Williams is here for follow up of HIV She continues on Genvoya  and denies any missed doses.  She has no issues getting or taking her medication.  She recently noted significant right knee pain.  No trauma, no twisting or any inciting event that she recalls.  No swelling.    08/25/23 id clinic f/u First time seeing me On genvoya  no missed dose last 4 weeks Complains of weight gain Hx tb no sign of relapse ?unclear hx hep c Complains of tonsils enlargement and cervical adenopathy and runny nose Does have seasonal allergy   Review of Systems  Constitutional:  Negative for fatigue.  Gastrointestinal:  Negative for diarrhea.  Skin:  Negative for rash.       Objective:   Vitals:   08/25/23 0941  BP: (!) 157/89  Pulse: 85  Temp: 97.8 F (36.6 C)  SpO2: 97%   Body mass index is 38.9 kg/m.   General/constitutional: no distress, pleasant HEENT: slight enlargement tonsils and cervical adenopathy on 08/25/23 exa Neck supple CV: rrr no mrg Lungs: clear to auscultation, normal respiratory effort Abd: Soft, Nontender Ext: no edema Skin: No Rash Neuro: nonfocal MSK: no peripheral joint swelling/tenderness/warmth; back spines nontender   LABS: Lab Results  Component Value Date   WBC 6.4 12/05/2022   HGB 14.3 12/05/2022   HCT 42.2 12/05/2022   MCV 92.5 12/05/2022   PLT 211 12/05/2022   Last metabolic panel Lab Results  Component Value Date   GLUCOSE 114 (H) 05/26/2023   NA 140 05/26/2023   K 4.4 05/26/2023   CL 102 05/26/2023   CO2 24 05/26/2023   BUN 13 05/26/2023   CREATININE 1.03 05/26/2023   EGFR 64 05/26/2023   CALCIUM  9.1 05/26/2023   PROT 7.7 05/26/2023   ALBUMIN 4.3 04/06/2020   LABGLOB 2.8 04/06/2020   AGRATIO 1.5 04/06/2020   BILITOT 0.3 05/26/2023   ALKPHOS 73 04/06/2020   AST 16 05/26/2023   ALT 15 05/26/2023   ANIONGAP 7 12/05/2022    HIV: 05/2023       UD      /        663  (29%) 10/2020                  /        710  (28%)           Assessment & Plan:   #hiv #reprieve/cad -dx'ed mid 1990s; tb found at dx; heterosexual? 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 [employer]    #other pmh -asthma Mild intermittent; no flare/attack for a long time at least 4 years -allergic rhinitis Zyrtec  and flonase  given today 08/2023 Out of beclomethasone nasal spray   #uri -sx support (allergy meds, prn tylenol ) -no abx indicated   #hcm -vaccination -hepatitis 07/2012 hep b sAb reactive; sAg and cAb nonreactive 07/2012 hep c Ab positive She is not aware she had hep c in the past; will test hcv rna next visit -std screening No hx syphilis 05/2023 urine gc/chlam and serum rpr titer  negative -tb screening Hx pulm tb s/p treatment in [place] in 1990s -cancer screening Defer to pcp

## 2023-09-15 ENCOUNTER — Ambulatory Visit: Admitting: Internal Medicine

## 2023-11-03 ENCOUNTER — Other Ambulatory Visit: Payer: Self-pay

## 2023-11-03 DIAGNOSIS — B20 Human immunodeficiency virus [HIV] disease: Secondary | ICD-10-CM

## 2023-11-03 DIAGNOSIS — Z113 Encounter for screening for infections with a predominantly sexual mode of transmission: Secondary | ICD-10-CM

## 2023-11-10 ENCOUNTER — Other Ambulatory Visit

## 2023-11-10 ENCOUNTER — Other Ambulatory Visit: Payer: Self-pay

## 2023-11-10 DIAGNOSIS — B20 Human immunodeficiency virus [HIV] disease: Secondary | ICD-10-CM

## 2023-11-10 DIAGNOSIS — Z79899 Other long term (current) drug therapy: Secondary | ICD-10-CM

## 2023-11-10 DIAGNOSIS — Z113 Encounter for screening for infections with a predominantly sexual mode of transmission: Secondary | ICD-10-CM

## 2023-11-11 ENCOUNTER — Other Ambulatory Visit

## 2023-11-12 LAB — CBC WITH DIFFERENTIAL/PLATELET
Absolute Lymphocytes: 3342 {cells}/uL (ref 850–3900)
Absolute Monocytes: 529 {cells}/uL (ref 200–950)
Basophils Absolute: 40 {cells}/uL (ref 0–200)
Basophils Relative: 0.5 %
Eosinophils Absolute: 237 {cells}/uL (ref 15–500)
Eosinophils Relative: 3 %
HCT: 43.3 % (ref 35.0–45.0)
Hemoglobin: 14.6 g/dL (ref 11.7–15.5)
MCH: 31.2 pg (ref 27.0–33.0)
MCHC: 33.7 g/dL (ref 32.0–36.0)
MCV: 92.5 fL (ref 80.0–100.0)
MPV: 11.5 fL (ref 7.5–12.5)
Monocytes Relative: 6.7 %
Neutro Abs: 3753 {cells}/uL (ref 1500–7800)
Neutrophils Relative %: 47.5 %
Platelets: 246 Thousand/uL (ref 140–400)
RBC: 4.68 Million/uL (ref 3.80–5.10)
RDW: 12.6 % (ref 11.0–15.0)
Total Lymphocyte: 42.3 %
WBC: 7.9 Thousand/uL (ref 3.8–10.8)

## 2023-11-12 LAB — COMPLETE METABOLIC PANEL WITHOUT GFR
AG Ratio: 1.3 (calc) (ref 1.0–2.5)
ALT: 18 U/L (ref 6–29)
AST: 15 U/L (ref 10–35)
Albumin: 4.3 g/dL (ref 3.6–5.1)
Alkaline phosphatase (APISO): 84 U/L (ref 37–153)
BUN: 14 mg/dL (ref 7–25)
CO2: 27 mmol/L (ref 20–32)
Calcium: 9.4 mg/dL (ref 8.6–10.4)
Chloride: 102 mmol/L (ref 98–110)
Creat: 0.91 mg/dL (ref 0.50–1.03)
Globulin: 3.4 g/dL (ref 1.9–3.7)
Glucose, Bld: 96 mg/dL (ref 65–99)
Potassium: 4.4 mmol/L (ref 3.5–5.3)
Sodium: 137 mmol/L (ref 135–146)
Total Bilirubin: 0.4 mg/dL (ref 0.2–1.2)
Total Protein: 7.7 g/dL (ref 6.1–8.1)

## 2023-11-12 LAB — LIPID PANEL
Cholesterol: 214 mg/dL — ABNORMAL HIGH (ref <200)
HDL: 37 mg/dL — ABNORMAL LOW (ref >=50)
Non-HDL Cholesterol (Calc): 177 mg/dL — ABNORMAL HIGH (ref <130)
Total CHOL/HDL Ratio: 5.8 (calc) — ABNORMAL HIGH (ref <5.0)
Triglycerides: 493 mg/dL — ABNORMAL HIGH (ref <150)

## 2023-11-12 LAB — RPR: RPR Ser Ql: NONREACTIVE

## 2023-11-12 LAB — HIV-1 RNA QUANT-NO REFLEX-BLD
HIV 1 RNA Quant: NOT DETECTED {copies}/mL
HIV-1 RNA Quant, Log: NOT DETECTED {Log_copies}/mL

## 2023-11-12 LAB — HEPATITIS B SURFACE ANTIBODY, QUANTITATIVE: Hep B S AB Quant (Post): 987 m[IU]/mL (ref >=10)

## 2023-11-25 ENCOUNTER — Ambulatory Visit: Payer: Self-pay | Admitting: Internal Medicine

## 2023-11-25 ENCOUNTER — Other Ambulatory Visit: Payer: Self-pay

## 2023-11-25 VITALS — BP 166/94 | HR 88 | Temp 98.7°F | Wt 244.0 lb

## 2023-11-25 DIAGNOSIS — Z79899 Other long term (current) drug therapy: Secondary | ICD-10-CM | POA: Diagnosis not present

## 2023-11-25 DIAGNOSIS — B20 Human immunodeficiency virus [HIV] disease: Secondary | ICD-10-CM | POA: Diagnosis present

## 2023-11-25 DIAGNOSIS — Q829 Congenital malformation of skin, unspecified: Secondary | ICD-10-CM | POA: Diagnosis not present

## 2023-11-25 DIAGNOSIS — E669 Obesity, unspecified: Secondary | ICD-10-CM | POA: Diagnosis not present

## 2023-11-25 DIAGNOSIS — Z8611 Personal history of tuberculosis: Secondary | ICD-10-CM | POA: Diagnosis not present

## 2023-11-25 DIAGNOSIS — Z113 Encounter for screening for infections with a predominantly sexual mode of transmission: Secondary | ICD-10-CM | POA: Diagnosis not present

## 2023-11-25 DIAGNOSIS — Z23 Encounter for immunization: Secondary | ICD-10-CM

## 2023-11-25 MED ORDER — ZOSTER VAC RECOMB ADJUVANTED 50 MCG/0.5ML IM SUSR: 0.5000 mL | Freq: Once | INTRAMUSCULAR | 0 refills | Status: AC

## 2023-11-25 NOTE — Addendum Note (Signed)
 Addended by: Daemyn Gariepy M on: 11/25/2023 10:22 AM   Modules accepted: Orders

## 2023-11-25 NOTE — Progress Notes (Signed)
 Subjective:    Patient ID: Paula Williams Doctor, female    DOB: 01/11/1971, 53 y.o.   MRN: [id number]  HPI [place] is here for follow up of HIV She continues on Genvoya  and denies any missed doses.  She has no issues getting or taking her medication.  She recently noted significant right knee pain.  No trauma, no twisting or any inciting event that she recalls.  No swelling.    08/25/23 id clinic f/u First time seeing me On genvoya  no missed dose last 4 weeks Complains of weight gain Hx tb no sign of relapse ?unclear hx hep c Complains of tonsils enlargement and cervical adenopathy and runny nose Does have seasonal allergy   11/25/23 id clinic f/u Wants to do covid/flu vaccine No complaint otherwise See a&p for detail   Review of Systems  Constitutional:  Negative for fatigue.  Gastrointestinal:  Negative for diarrhea.  Skin:  Negative for rash.       Objective:   Vitals:   11/25/23 0948  BP: (!) 166/94  Pulse: 88  Temp: 98.7 F (37.1 C)  SpO2: 95%   Body mass index is 39.38 kg/m.   General/constitutional: no distress, pleasant HEENT: slight enlargement tonsils and cervical adenopathy on 08/25/23 exa Neck supple CV: rrr no mrg Lungs: clear to auscultation, normal respiratory effort Abd: Soft, Nontender Ext: no edema Skin: No Rash Neuro: nonfocal MSK: no peripheral joint swelling/tenderness/warmth; back spines nontender   LABS: Lab Results  Component Value Date   WBC 7.9 11/10/2023   HGB 14.6 11/10/2023   HCT 43.3 11/10/2023   MCV 92.5 11/10/2023   PLT 246 11/10/2023   Last metabolic panel Lab Results  Component Value Date   GLUCOSE 96 11/10/2023   NA 137 11/10/2023   K 4.4 11/10/2023   CL 102 11/10/2023   CO2 27 11/10/2023   BUN 14 11/10/2023   CREATININE 0.91 11/10/2023   EGFR 64 05/26/2023   CALCIUM  9.4 11/10/2023   PROT 7.7 11/10/2023   ALBUMIN 4.3 04/06/2020   LABGLOB 2.8 04/06/2020   AGRATIO 1.5 04/06/2020   BILITOT 0.4 11/10/2023    ALKPHOS 73 04/06/2020   AST 15 11/10/2023   ALT 18 11/10/2023   ANIONGAP 7 12/05/2022   HIV: 05/2023       UD      /        663  (29%) 10/2020                  /        710  (28%)           Assessment & Plan:   #hiv #reprieve/cad -dx'ed mid 1990s; tb found at dx; heterosexual transmission presumed (Didn't know how she got it) -therapy started after dx'ed Genvoya  stribild Fosamprenavir /ritonavir /truvada Zidovudine  11/25/23 id clinic visit; we switched from genvoya  to biktarvy on 08/25/23. Tolerating it well. Here for early viral load testing after switch. 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 [employer] She has been to  wellness/nutrition and wants to try pharmacologic  -refer to endocrinology for consideration of phentermine vs other options for weight loss   #other pmh -asthma Mild intermittent; no flare/attack for a long time at least 4 years -allergic rhinitis Zyrtec  and flonase  given today 08/2023 Out of beclomethasone nasal spray  #skin anomoly Complains of her facial skin not looking smooth  -I am not sure what to do for this --referral to dermatology   #hcm -vaccination Flu/covid vaccine 11/25/23 Rx for shingrix -hepatitis 07/2012 hep b sAb reactive; sAg and cAb nonreactive 07/2012 hep c Ab positive 10/2023 hep b sAb 987 She is not aware she had hep c in the past; already got tests for hiv done 11/10/23 and we won't do hcv rna today; advise she can get blood test done the day of next visit so can do hcv rna -std  screening No hx syphilis 10/2023 rpr negative 05/2023 urine gc/chlam and serum rpr titer negative -metabolic/reprieve Continue lipitor Trig a little high 490s on 10/2023; no hx pancreatitis -tb screening Hx pulm tb s/p treatment in [place] in 1990s 11/25/23 no b sx or cough -cancer screening Defer to pcp for age appropriate cancer screening

## 2023-11-25 NOTE — Patient Instructions (Addendum)
 Weight issue -- I have placed referral to endocrine who would be prescribing the weight loss medication if they seem fit   Continue biktarvy    Continue lipitor for cholesterol and heart attack / stroke risk reduction   Please let us  know in 2 months again so we can put in your 2nd dose shingrix vaccine; the first dose is placed today to your [facility] pharmacy   You can see me again in around 9 months and we do labs here the day of   Dermatology referral

## 2023-12-07 ENCOUNTER — Encounter: Payer: Self-pay | Admitting: Internal Medicine

## 2024-02-23 ENCOUNTER — Encounter (INDEPENDENT_AMBULATORY_CARE_PROVIDER_SITE_OTHER): Payer: Self-pay

## 2024-03-08 ENCOUNTER — Other Ambulatory Visit: Payer: Self-pay

## 2024-03-08 ENCOUNTER — Ambulatory Visit: Admitting: Radiology

## 2024-03-08 ENCOUNTER — Ambulatory Visit
Admission: EM | Admit: 2024-03-08 | Discharge: 2024-03-08 | Disposition: A | Discharge status: Home / Self Care | Attending: Physician Assistant | Admitting: Physician Assistant

## 2024-03-08 DIAGNOSIS — M79645 Pain in left finger(s): Secondary | ICD-10-CM | POA: Diagnosis not present

## 2024-03-08 NOTE — ED Triage Notes (Signed)
 Pt presents with c/o left thumb pain that is radiating into the left arm. Pain/sxs have been present for approximately three weeks. Describes as intermittent, sharp pains mainly with movement. Denies recent injuries. Denies pain in triage room (sitting, at rest). No OTC medications taken PTA for symptoms/pain reported.

## 2024-03-08 NOTE — Discharge Instructions (Signed)
 You were seen today for concerns of left-sided thumb pain that radiates up into your forearm.  At this time I suspect that your symptoms may be due to inflammation causing irritation in that area.  I have included information regarding de Quervain's tenosynovitis as well as carpal tunnel syndrome for you to review.  We are still waiting on radiology to review your x-rays and we will keep you updated with those results via MyChart as well as a phone call if further intervention is required.  For now I recommend alternating Tylenol  and ibuprofen  as needed for pain.  You can use warm compresses to the area as well as the brace that we fitted you with tonight to assist with symptoms.  If you feel like your symptoms are not improving or seem to be worsening please follow-up with orthopedics for further evaluation.
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
## 2024-06-30 ENCOUNTER — Ambulatory Visit: Admitting: Dermatology

## 2024-08-16 ENCOUNTER — Other Ambulatory Visit

## 2024-08-30 ENCOUNTER — Ambulatory Visit: Payer: Self-pay | Admitting: Internal Medicine
